# Patient Record
Sex: Female | Born: 2000
Health system: Southern US, Community
[De-identification: ages and names within clinical notes are randomized; demographics above are authoritative.]

## PROBLEM LIST (undated history)

## (undated) DIAGNOSIS — G43909 Migraine, unspecified, not intractable, without status migrainosus: Secondary | ICD-10-CM

## (undated) DIAGNOSIS — E669 Obesity, unspecified: Secondary | ICD-10-CM

## (undated) DIAGNOSIS — I2699 Other pulmonary embolism without acute cor pulmonale: Secondary | ICD-10-CM

## (undated) DIAGNOSIS — L309 Dermatitis, unspecified: Secondary | ICD-10-CM

## (undated) DIAGNOSIS — K2970 Gastritis, unspecified, without bleeding: Secondary | ICD-10-CM

## (undated) DIAGNOSIS — I82409 Acute embolism and thrombosis of unspecified deep veins of unspecified lower extremity: Secondary | ICD-10-CM

## (undated) HISTORY — PX: FOOT SURGERY: SHX648

## (undated) HISTORY — DX: Acute embolism and thrombosis of unspecified deep veins of unspecified lower extremity: I82.409

## (undated) HISTORY — DX: Dermatitis, unspecified: L30.9

## (undated) HISTORY — DX: Migraine, unspecified, not intractable, without status migrainosus: G43.909

## (undated) HISTORY — DX: Obesity, unspecified: E66.9

## (undated) HISTORY — DX: Gastritis, unspecified, without bleeding: K29.70

## (undated) HISTORY — DX: Other pulmonary embolism without acute cor pulmonale: I26.99

---
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
## 2019-04-24 ENCOUNTER — Emergency Department (HOSPITAL_COMMUNITY): Payer: Self-pay

## 2019-04-24 ENCOUNTER — Emergency Department (HOSPITAL_COMMUNITY)
Admission: EM | Admit: 2019-04-24 | Discharge: 2019-04-24 | Disposition: A | Discharge status: Home / Self Care | Payer: Self-pay | Attending: Emergency Medicine | Admitting: Emergency Medicine

## 2019-04-24 ENCOUNTER — Other Ambulatory Visit: Payer: Self-pay

## 2019-04-24 ENCOUNTER — Encounter (HOSPITAL_COMMUNITY): Payer: Self-pay

## 2019-04-24 DIAGNOSIS — R05 Cough: Secondary | ICD-10-CM | POA: Insufficient documentation

## 2019-04-24 DIAGNOSIS — R1110 Vomiting, unspecified: Secondary | ICD-10-CM | POA: Insufficient documentation

## 2019-04-24 DIAGNOSIS — R070 Pain in throat: Secondary | ICD-10-CM | POA: Insufficient documentation

## 2019-04-24 DIAGNOSIS — Z20822 Contact with and (suspected) exposure to covid-19: Secondary | ICD-10-CM | POA: Insufficient documentation

## 2019-04-24 DIAGNOSIS — R197 Diarrhea, unspecified: Secondary | ICD-10-CM | POA: Insufficient documentation

## 2019-04-24 DIAGNOSIS — R0981 Nasal congestion: Secondary | ICD-10-CM | POA: Insufficient documentation

## 2019-04-24 DIAGNOSIS — Z7722 Contact with and (suspected) exposure to environmental tobacco smoke (acute) (chronic): Secondary | ICD-10-CM | POA: Insufficient documentation

## 2019-04-24 DIAGNOSIS — J0100 Acute maxillary sinusitis, unspecified: Secondary | ICD-10-CM | POA: Insufficient documentation

## 2019-04-24 DIAGNOSIS — J45909 Unspecified asthma, uncomplicated: Secondary | ICD-10-CM | POA: Insufficient documentation

## 2019-04-24 LAB — CBC WITH DIFFERENTIAL/PLATELET
Abs Immature Granulocytes: 0.02 10*3/uL (ref 0.00–0.07)
Basophils Absolute: 0.1 10*3/uL (ref 0.0–0.1)
Basophils Relative: 1 %
Eosinophils Absolute: 0.5 10*3/uL (ref 0.0–0.5)
Eosinophils Relative: 5 %
HCT: 38.5 % (ref 36.0–46.0)
Hemoglobin: 12.6 g/dL (ref 12.0–15.0)
Immature Granulocytes: 0 %
Lymphocytes Relative: 32 %
Lymphs Abs: 2.8 10*3/uL (ref 0.7–4.0)
MCH: 28.7 pg (ref 26.0–34.0)
MCHC: 32.7 g/dL (ref 30.0–36.0)
MCV: 87.7 fL (ref 80.0–100.0)
Monocytes Absolute: 0.6 10*3/uL (ref 0.1–1.0)
Monocytes Relative: 7 %
Neutro Abs: 4.9 10*3/uL (ref 1.7–7.7)
Neutrophils Relative %: 55 %
Platelets: 385 10*3/uL (ref 150–400)
RBC: 4.39 MIL/uL (ref 3.87–5.11)
RDW: 13.2 % (ref 11.5–15.5)
WBC: 8.9 10*3/uL (ref 4.0–10.5)
nRBC: 0 % (ref 0.0–0.2)

## 2019-04-24 LAB — URINALYSIS, ROUTINE W REFLEX MICROSCOPIC
Bilirubin Urine: NEGATIVE
Glucose, UA: NEGATIVE mg/dL
Hgb urine dipstick: NEGATIVE
Ketones, ur: NEGATIVE mg/dL
Leukocytes,Ua: NEGATIVE
Nitrite: NEGATIVE
Protein, ur: NEGATIVE mg/dL
Specific Gravity, Urine: 1.024 (ref 1.005–1.030)
pH: 7 (ref 5.0–8.0)

## 2019-04-24 LAB — COMPREHENSIVE METABOLIC PANEL
ALT: 19 U/L (ref 0–44)
AST: 18 U/L (ref 15–41)
Albumin: 4 g/dL (ref 3.5–5.0)
Alkaline Phosphatase: 66 U/L (ref 38–126)
Anion gap: 8 (ref 5–15)
BUN: 13 mg/dL (ref 6–20)
CO2: 24 mmol/L (ref 22–32)
Calcium: 8.9 mg/dL (ref 8.9–10.3)
Chloride: 107 mmol/L (ref 98–111)
Creatinine, Ser: 0.78 mg/dL (ref 0.44–1.00)
GFR calc Af Amer: >60 mL/min (ref >60)
GFR calc non Af Amer: >60 mL/min (ref >60)
Glucose, Bld: 89 mg/dL (ref 70–99)
Potassium: 4 mmol/L (ref 3.5–5.1)
Sodium: 139 mmol/L (ref 135–145)
Total Bilirubin: 0.9 mg/dL (ref 0.3–1.2)
Total Protein: 7.4 g/dL (ref 6.5–8.1)

## 2019-04-24 LAB — GROUP A STREP BY PCR: Group A Strep by PCR: NOT DETECTED

## 2019-04-24 LAB — LIPASE, BLOOD: Lipase: 18 U/L (ref 11–51)

## 2019-04-24 LAB — PREGNANCY, URINE: Preg Test, Ur: NEGATIVE

## 2019-04-24 LAB — POC SARS CORONAVIRUS 2 AG -  ED: SARS Coronavirus 2 Ag: NEGATIVE

## 2019-04-24 MED ORDER — PROMETHAZINE HCL 25 MG/ML IJ SOLN
12.5000 mg | Freq: Once | INTRAMUSCULAR | Status: AC
  Administered 2019-04-24: 12.5 mg via INTRAVENOUS
  Filled 2019-04-24: qty 1

## 2019-04-24 MED ORDER — ONDANSETRON HCL 4 MG/2ML IJ SOLN
4.0000 mg | Freq: Once | INTRAMUSCULAR | Status: AC
  Administered 2019-04-24: 19:00:00 4 mg via INTRAVENOUS
  Filled 2019-04-24: qty 2

## 2019-04-24 MED ORDER — AMOXICILLIN-POT CLAVULANATE 875-125 MG PO TABS: 1.0000 | ORAL_TABLET | Freq: Two times a day (BID) | ORAL | 0 refills | Status: DC

## 2019-04-24 MED ORDER — ACETAMINOPHEN 325 MG PO TABS
650.0000 mg | ORAL_TABLET | Freq: Once | ORAL | Status: AC
  Administered 2019-04-24: 20:00:00 650 mg via ORAL
  Filled 2019-04-24: qty 2

## 2019-04-24 MED ORDER — ALBUTEROL SULFATE HFA 108 (90 BASE) MCG/ACT IN AERS
4.0000 | INHALATION_SPRAY | Freq: Once | RESPIRATORY_TRACT | Status: AC
  Administered 2019-04-24: 22:00:00 4 via RESPIRATORY_TRACT
  Filled 2019-04-24: qty 6.7

## 2019-04-24 MED ORDER — BENZONATATE 100 MG PO CAPS: 100.0000 mg | ORAL_CAPSULE | Freq: Three times a day (TID) | ORAL | 0 refills | Status: DC | PRN

## 2019-04-24 MED ORDER — BENZONATATE 100 MG PO CAPS
200.0000 mg | ORAL_CAPSULE | Freq: Once | ORAL | Status: AC
  Administered 2019-04-24: 200 mg via ORAL
  Filled 2019-04-24: qty 2

## 2019-04-24 MED ORDER — ONDANSETRON 4 MG PO TBDP: 4.0000 mg | ORAL_TABLET | Freq: Three times a day (TID) | ORAL | 0 refills | Status: DC | PRN

## 2019-04-24 MED ORDER — SODIUM CHLORIDE 0.9 % IV BOLUS
1000.0000 mL | Freq: Once | INTRAVENOUS | Status: AC
  Administered 2019-04-24: 19:00:00 1000 mL via INTRAVENOUS

## 2019-04-24 NOTE — Discharge Instructions (Addendum)
You were seen in the ED for cough, congestion, sore throat, vomiting, diarrhea.  I suspect you have a virus. We tested your for COVID-19 (coronavirus) infection.  It is also possible you could have other viral upper respiratory infection from another virus.    -Prescription has been sent to your pharmacy for Zofran.  This is for nausea.  Please take if needed. -Prescription sent for Augmentin.  This is an antibiotic used to treat sinus infection.  Please take as prescribed. -Prescription also sent for Tessalon.  This is a cough medicine.  Please take as prescribed and if needed.  Test results come back in 24  hours, sometimes sooner.  Someone will call you if ypu are positive for COVID. If the result is negative you can see it on your MyChart.  Treatment of your illness and symptoms will include self-isolation, monitoring of symptoms and supportive care with over-the-counter medicines.    Return to the ED if there is increased work of breathing, shortness of breath, inability to tolerate fluids, weakness, chest pain.  If your test results are POSITIVE, the following isolation requirements need to be met to return to work and resume essential activities: At least 10 days since symptom onset  72 hours of absence of fever without antifever medicine (ibuprofen, acetaminophen). A fever is temperature of 100.32F or greater. Improvement of respiratory symptoms  If your test is NEGATIVE, you may return to work and essential activities as long as your symptoms have improved and you do not have a fever for a total of 3 days.  Call your job and notify them that your test result was negative to see if they will allow you to return to work.   Stay well-hydrated. Rest. You can use over the counter medications to help with symptoms: 600 mg ibuprofen (motrin, aleve, advil) or acetaminophen (tylenol) every 6 hours, around the clock to help with associated fevers, sore throat, headaches, generalized body aches and  malaise.  Oxymetazoline (afrin) intranasal spray once daily for no more than 3 days to help with congestion, after 3 days you can switch to another over-the-counter nasal steroid spray such as fluticasone (flonase) Allergy medication (loratadine, cetirizine, etc) and phenylephrine (sudafed) help with nasal congestion, runny nose and postnasal drip.   Wash your hands often to prevent spread.

## 2019-04-24 NOTE — ED Provider Notes (Signed)
[facility] EMERGENCY DEPARTMENT Provider Note   CSN: [id number] Arrival date & time: 04/24/19  1726     History Chief Complaint  Patient presents with  . Headache  . Cough    [name] is a 19 y.o. female past medical history significant for asthma, seasonal allergies presents to emergency department today with chief complaint intermittent headache, congestion, cough x1 week.  Patient states she has history of severe seasonal allergies.  She has been taking Zyrtec at home without any symptom relief. She is reporting nasal congestion with sinus pressure that is worse when leaning forward. She also has sore throat that she describes as raw sensation when swallowing. Today she has 1 episode of non bloody non bilious emesis and 1 episode of non bloody diarrhea. She denies any associated abdominal pain. She states her headache has been mild. She does not have headache currently but has intermittently since symptoms started. She denies any associated neck pain or neck stiffness, no visual changes. She also denies fever, chills, loss of sense of smell or taste, shortness of breath, chest pain, urinary symptoms, vaginal discharge, abnormal vaginal bleeding.. rash.  History provided by patient with additional history obtained from chart review.     Past Medical History:  Diagnosis Date  . Asthma   . Eczema   . Obesity     Patient Active Problem List   Diagnosis Date Noted  . Obesity peds (BMI >=95 percentile) 04/25/2016  . Allergic conjunctivitis of both eyes 04/25/2016  . Onychomycosis 04/25/2016  . Chronic pain of right knee 04/25/2016  . Eczema 04/25/2016  . Mild intermittent asthma, uncomplicated 04/25/2016    Past Surgical History:  Procedure Laterality Date  . FOOT SURGERY     bilateral arch repair     OB History    Gravida  0   Para  0   Term  0   Preterm  0   AB  0   Living  0     SAB  0   TAB  0   Ectopic  0   Multiple  0   Live Births  0          Family History  Problem Relation Age of Onset  . Healthy Mother     Social History   Tobacco Use  . Smoking status: Passive Smoke Exposure - Never Smoker  . Smokeless tobacco: Never Used  Substance Use Topics  . Alcohol use: Yes    Comment: occ  . Drug use: No    Home Medications Prior to Admission medications   Medication Sig Start Date End Date Taking? Authorizing Provider  cetirizine (ZYRTEC) 10 MG tablet Take 10 mg by mouth daily.   Yes [provider]  EPINEPHrine (EPIPEN 2-PAK) 0.3 mg/0.3 mL IJ SOAJ injection Inject 0.3 mLs (0.3 mg total) into the muscle as needed for anaphylaxis. 08/04/18  Yes Pollina, Canary Brim, MD  albuterol (PROAIR HFA) 108 (90 Base) MCG/ACT inhaler 2 puffs every 4 to 6 hours as needed for wheezing or cough Patient not taking: Reported on 04/24/2019 01/17/18   Dione Booze, MD  albuterol (PROVENTIL) (2.5 MG/3ML) 0.083% nebulizer solution 1 vial via neb Q4h x 3 days then Q6h x 3 days then Q4-6h prn Patient not taking: Reported on 04/24/2019 01/17/18   Dione Booze, MD  amoxicillin-clavulanate (AUGMENTIN) 875-125 MG tablet Take 1 tablet by mouth every 12 (twelve) hours. 04/24/19   Alicia Ackert E, PA-C  benzonatate (TESSALON) 100 MG capsule Take  1 capsule (100 mg total) by mouth every 8 (eight) hours as needed for cough. 04/24/19   Levy Wellman E, PA-C  ondansetron (ZOFRAN ODT) 4 MG disintegrating tablet Take 1 tablet (4 mg total) by mouth every 8 (eight) hours as needed for nausea or vomiting. 04/24/19   Brooklyne Radke, Caroleen Hamman, PA-C    Allergies    Hydrocortisone-emollient  Review of Systems   Review of Systems  All other systems are reviewed and are negative for acute change except as noted in the HPI.   Physical Exam Updated Vital Signs BP (!) 142/91 (BP Location: Right Arm)   Pulse 77   Temp 97.8 F (36.6 C) (Tympanic)   Resp 16   Ht 5\' 5"  (1.651 m)   Wt 108.9 kg   LMP 04/02/2019   SpO2 99%   BMI 39.94 kg/m    Physical Exam Vitals and nursing note reviewed.  Constitutional:      General: She is not in acute distress.    Appearance: She is not ill-appearing.     Comments: Patient actively vomiting during exam  HENT:     Head: Normocephalic and atraumatic.     Comments: No temporal tenderness.  Sinus tenderness to maxillary sinuses     Right Ear: Tympanic membrane and external ear normal.     Left Ear: Tympanic membrane and external ear normal.     Nose: Congestion present.     Mouth/Throat:     Mouth: Mucous membranes are moist.     Pharynx: Oropharynx is clear.     Comments: Minor erythema to oropharynx, no edema, no exudate, no tonsillar swelling, voice normal, neck supple without lymphadenopathy  Eyes:     General: No scleral icterus.       Right eye: No discharge.        Left eye: No discharge.     Extraocular Movements: Extraocular movements intact.     Conjunctiva/sclera: Conjunctivae normal.     Pupils: Pupils are equal, round, and reactive to light.  Neck:     Vascular: No JVD.  Cardiovascular:     Rate and Rhythm: Normal rate and regular rhythm.     Pulses: Normal pulses.          Radial pulses are 2+ on the right side and 2+ on the left side.     Heart sounds: Normal heart sounds.  Pulmonary:     Comments: Lungs clear to auscultation in all fields. Symmetric chest rise. No wheezing, rales, or rhonchi. Oxygen saturation 99% on room air Abdominal:     Tenderness: There is no right CVA tenderness or left CVA tenderness.     Comments: Abdomen is soft, non-distended, and non-tender in all quadrants. No rigidity, no guarding. No peritoneal signs.  Musculoskeletal:        General: Normal range of motion.     Cervical back: Normal range of motion.  Skin:    General: Skin is warm and dry.     Capillary Refill: Capillary refill takes less than 2 seconds.  Neurological:     Mental Status: She is oriented to person, place, and time.     GCS: GCS eye subscore is 4. GCS verbal  subscore is 5. GCS motor subscore is 6.     Comments: Speech is clear and goal oriented, follows commands CN III-XII intact, no facial droop Normal strength in upper and lower extremities bilaterally including dorsiflexion and plantar flexion, strong and equal grip strength Sensation normal to light and  sharp touch Moves extremities without ataxia, coordination intact Normal finger to nose and rapid alternating movements Normal gait and balance   Psychiatric:        Behavior: Behavior normal.     ED Results / Procedures / Treatments   Labs (all labs ordered are listed, but only abnormal results are displayed) Labs Reviewed  GROUP A STREP BY PCR  SARS CORONAVIRUS 2 (TAT 6-24 HRS)  CBC WITH DIFFERENTIAL/PLATELET  COMPREHENSIVE METABOLIC PANEL  LIPASE, BLOOD  URINALYSIS, ROUTINE W REFLEX MICROSCOPIC  PREGNANCY, URINE  POC SARS CORONAVIRUS 2 AG -  ED    EKG None  Radiology DG Chest Portable 1 View  Result Date: 04/24/2019 CLINICAL DATA:  19 year old female with cough and shortness of breath for several days. EXAM: PORTABLE CHEST 1 VIEW COMPARISON:  Chest radiographs 01/16/2018 and earlier. FINDINGS: Portable AP semi upright view at 1837 hours. Lung volumes and mediastinal contours remain within normal limits. Allowing for portable technique the lungs are clear. No pneumothorax or pleural effusion. Visualized tracheal air column is within normal limits. Negative visible bowel gas pattern and osseous structures. IMPRESSION: Negative portable chest. Electronically Signed   By: Genevie Ann M.D.   On: 04/24/2019 19:00    Procedures Procedures (including critical care time)  Medications Ordered in ED Medications  sodium chloride 0.9 % bolus 1,000 mL (0 mLs Intravenous Stopped 04/24/19 1951)  ondansetron (ZOFRAN) injection 4 mg (4 mg Intravenous Given 04/24/19 1843)  acetaminophen (TYLENOL) tablet 650 mg (650 mg Oral Given 04/24/19 2024)  promethazine (PHENERGAN) injection 12.5 mg (12.5 mg  Intravenous Given 04/24/19 2110)  benzonatate (TESSALON) capsule 200 mg (200 mg Oral Given 04/24/19 2137)  albuterol (VENTOLIN HFA) 108 (90 Base) MCG/ACT inhaler 4-6 puff (4 puffs Inhalation Given 04/24/19 2137)    ED Course  I have reviewed the triage vital signs and the nursing notes.  Pertinent labs & imaging results that were available during my care of the patient were reviewed by me and considered in my medical decision making (see chart for details).    MDM Rules/Calculators/A&P                      Patient seen and examined. Patient presents awake, alert, hemodynamically stable, afebrile, non toxic. On exam she looks to not feel well however does not appear toxic.  Lungs are clear to auscultation all fields.  Normal work of breathing, no abdominal tenderness, no peritoneal signs.  She has erythema to posterior oropharynx without exudate, deep space infection, PTA. No neck pain or meningeal signs.  Neuro exam is normal.  Towards end of my exam patient had one episode of nonbloody nonbilious emesis.  DDx includes URI, Covid, sinus infection, strep, viral GI illness, Covid, asthma exacerbation. I viewed pt's chest xray and it does not suggest acute infectious processes  Labs show no leukocytosis, no anemia, no renal insufficiency, no severe electrolyte derangement.  Lipase is within normal range.  UA is negative for infection.  Urine pregnancy test is negative.  Rapid Covid test is negative.  Discussed with patient this could be false negative and will perform PCR test to confirm.  Strep test is negative.  Patient given Zofran and IV fluids.  On reassessment she had a second episode of emesis. Phenergan given and on reassessment she is tolerating p.o. intake.  Serial abdominal exams have been benign, no indications of acute surgical abdomen or for emergent imaging. Will discharge home with symptomatic care including antibiotic for possible sinus  infection as she has had sinus congestion and  pressure x7 days.  The patient appears reasonably screened and/or stabilized for discharge and I doubt any other medical condition or other [facility] requiring further screening, evaluation, or treatment in the ED at this time prior to discharge. The patient is safe for discharge with strict return precautions discussed. Recommend pcp follow up if symptoms persist.   Grae L Goehring was evaluated in Emergency Department on 04/24/2019 for the symptoms described in the history of present illness. She was evaluated in the context of the global COVID-19 pandemic, which necessitated consideration that the patient might be at risk for infection with the SARS-CoV-2 virus that causes COVID-19. Institutional protocols and algorithms that pertain to the evaluation of patients at risk for COVID-19 are in a state of rapid change based on information released by regulatory bodies including the CDC and federal and state organizations. These policies and algorithms were followed during the patient's care in the ED.   Portions of this note were generated with Scientist, clinical (histocompatibility and immunogenetics). Dictation errors may occur despite best attempts at proofreading.   Final Clinical Impression(s) / ED Diagnoses Final diagnoses:  Acute maxillary sinusitis, recurrence not specified    Rx / DC Orders ED Discharge Orders         Ordered    ondansetron (ZOFRAN ODT) 4 MG disintegrating tablet  Every 8 hours PRN     04/24/19 2231    amoxicillin-clavulanate (AUGMENTIN) 875-125 MG tablet  Every 12 hours     04/24/19 2231    benzonatate (TESSALON) 100 MG capsule  Every 8 hours PRN     04/24/19 2231           Brittony Billick, Caroleen Hamman, PA-C 04/24/19 2239    Vanetta Mulders, MD 05/01/19 1043

## 2019-04-24 NOTE — ED Triage Notes (Addendum)
Pt c/o headache, N/V/D, sore throat, dry cough and chills x1 week. Pt states she has severe seasonal allergies but that some of the symptoms "feel different than normal".   Pt eyes appear red and watery in triage. Pt actively coughing.  Pt states she has been taking Zyrtec and Cetirizine wihtout relief.   Pt requesting COVID test.

## 2019-04-24 NOTE — ED Notes (Signed)
Pt vomiting after po challenge. Provider aware

## 2019-04-25 LAB — SARS CORONAVIRUS 2 (TAT 6-24 HRS): SARS Coronavirus 2: NEGATIVE

## 2019-07-16 ENCOUNTER — Emergency Department (HOSPITAL_COMMUNITY): Payer: Medicaid Other

## 2019-07-16 ENCOUNTER — Encounter (HOSPITAL_COMMUNITY): Payer: Self-pay | Admitting: Emergency Medicine

## 2019-07-16 ENCOUNTER — Other Ambulatory Visit: Payer: Self-pay

## 2019-07-16 ENCOUNTER — Emergency Department (HOSPITAL_COMMUNITY)
Admission: EM | Admit: 2019-07-16 | Discharge: 2019-07-16 | Disposition: A | Discharge status: Home / Self Care | Payer: Medicaid Other | Attending: Emergency Medicine | Admitting: Emergency Medicine

## 2019-07-16 DIAGNOSIS — Z7722 Contact with and (suspected) exposure to environmental tobacco smoke (acute) (chronic): Secondary | ICD-10-CM | POA: Insufficient documentation

## 2019-07-16 DIAGNOSIS — M79605 Pain in left leg: Secondary | ICD-10-CM | POA: Insufficient documentation

## 2019-07-16 DIAGNOSIS — J4520 Mild intermittent asthma, uncomplicated: Secondary | ICD-10-CM | POA: Insufficient documentation

## 2019-07-16 MED ORDER — TRAMADOL HCL 50 MG PO TABS: 50.0000 mg | ORAL_TABLET | Freq: Four times a day (QID) | ORAL | 0 refills | Status: DC | PRN

## 2019-07-16 MED ORDER — NAPROXEN 500 MG PO TABS: 500.0000 mg | ORAL_TABLET | Freq: Two times a day (BID) | ORAL | 0 refills | Status: DC

## 2019-07-16 NOTE — ED Provider Notes (Signed)
[facility] EMERGENCY DEPARTMENT Provider Note   CSN: [id number] Arrival date & time: 07/16/19  1046     History Chief Complaint  Patient presents with  . Knee Pain    [name] is a 19 y.o. female.  Patient is a 19 year old female with no significant past medical history.  She presents today for evaluation of left knee and calf pain.  This began several days ago, but significantly worsened last night.  She denies any specific injury or trauma, but is on her feet throughout the day for work.  She denies any chest pain or difficulty breathing.  She denies any swelling.  The history is provided by the patient.  Knee Pain Location:  Leg Injury: no   Leg location:  L leg Pain details:    Quality:  Aching   Severity:  Moderate   Onset quality:  Gradual   Duration:  3 days   Timing:  Constant   Progression:  Worsening Chronicity:  New Relieved by:  Nothing Worsened by:  Bearing weight and activity Ineffective treatments:  None tried      Past Medical History:  Diagnosis Date  . Asthma   . Eczema   . Obesity     Patient Active Problem List   Diagnosis Date Noted  . Obesity peds (BMI >=95 percentile) 04/25/2016  . Allergic conjunctivitis of both eyes 04/25/2016  . Onychomycosis 04/25/2016  . Chronic pain of right knee 04/25/2016  . Eczema 04/25/2016  . Mild intermittent asthma, uncomplicated 04/25/2016    Past Surgical History:  Procedure Laterality Date  . FOOT SURGERY     bilateral arch repair     OB History    Gravida  0   Para  0   Term  0   Preterm  0   AB  0   Living  0     SAB  0   TAB  0   Ectopic  0   Multiple  0   Live Births  0           Family History  Problem Relation Age of Onset  . Healthy Mother     Social History   Tobacco Use  . Smoking status: Passive Smoke Exposure - Never Smoker  . Smokeless tobacco: Never Used  Vaping Use  . Vaping Use: Every day  . Substances: Nicotine, Mixture of cannabinoids    Substance Use Topics  . Alcohol use: Yes    Comment: occ  . Drug use: Yes    Home Medications Prior to Admission medications   Medication Sig Start Date End Date Taking? Authorizing Provider  EPINEPHrine (EPIPEN 2-PAK) 0.3 mg/0.3 mL IJ SOAJ injection Inject 0.3 mLs (0.3 mg total) into the muscle as needed for anaphylaxis. 08/04/18  Yes Pollina, Canary Brim, MD  albuterol (PROAIR HFA) 108 (90 Base) MCG/ACT inhaler 2 puffs every 4 to 6 hours as needed for wheezing or cough Patient not taking: Reported on 04/24/2019 01/17/18   Dione Booze, MD  albuterol (PROVENTIL) (2.5 MG/3ML) 0.083% nebulizer solution 1 vial via neb Q4h x 3 days then Q6h x 3 days then Q4-6h prn Patient not taking: Reported on 04/24/2019 01/17/18   Dione Booze, MD  amoxicillin-clavulanate (AUGMENTIN) 875-125 MG tablet Take 1 tablet by mouth every 12 (twelve) hours. Patient not taking: Reported on 07/16/2019 04/24/19   Albrizze, Yvonna Alanis E, PA-C  ondansetron (ZOFRAN ODT) 4 MG disintegrating tablet Take 1 tablet (4 mg total) by mouth every 8 (eight) hours  as needed for nausea or vomiting. Patient not taking: Reported on 07/16/2019 04/24/19   Albrizze, Caroleen Hamman, PA-C    Allergies    Hydrocortisone-emollient  Review of Systems   Review of Systems  All other systems reviewed and are negative.   Physical Exam Updated Vital Signs BP (!) 136/93   Pulse 78   Temp 98.5 F (36.9 C) (Oral)   Resp 18   Ht 5\' 6"  (1.676 m)   Wt 122.5 kg   LMP 07/02/2019 (Approximate)   SpO2 99%   BMI 43.58 kg/m   Physical Exam Vitals and nursing note reviewed.  Constitutional:      General: She is not in acute distress.    Appearance: Normal appearance. She is not ill-appearing.  HENT:     Head: Normocephalic and atraumatic.  Pulmonary:     Effort: Pulmonary effort is normal.  Musculoskeletal:     Cervical back: Normal range of motion and neck supple.     Comments: Knee appears grossly normal.  There is no palpable effusion.  Pain with  range of motion, but there is no crepitus.  There is tenderness of the left posterior calf.  There is no edema of the ankle or foot.  DP pulses are easily palpable and motor and sensation are intact.  Skin:    General: Skin is warm and dry.  Neurological:     Mental Status: She is alert.     ED Results / Procedures / Treatments   Labs (all labs ordered are listed, but only abnormal results are displayed) Labs Reviewed - No data to display  EKG None  Radiology No results found.  Procedures Procedures (including critical care time)  Medications Ordered in ED Medications - No data to display  ED Course  I have reviewed the triage vital signs and the nursing notes.  Pertinent labs & imaging results that were available during my care of the patient were reviewed by me and considered in my medical decision making (see chart for details).    MDM Rules/Calculators/A&P  Patient presenting here with complaints of left leg pain, the etiology of which I suspect is musculoskeletal.  Patient has pain to the posterior calf, however no evidence for DVT on ultrasound.  X-rays of the knee and foot are both unremarkable.  At this point, I feel as though emergent pathology has been ruled out.  Patient will be treated with an anti-inflammatory, tramadol, and is to follow-up with primary doctor if not improving in the next week.  Patient will be given a work excuse for the next several days.  Final Clinical Impression(s) / ED Diagnoses Final diagnoses:  None    Rx / DC Orders ED Discharge Orders    None       07/04/2019, MD 07/16/19 1400

## 2019-07-16 NOTE — ED Triage Notes (Signed)
Patient complains of left knee pain that began yesterday but has gotten progressively worse. Patient says the pain is behind her knee and is causing numbness to her toes.

## 2019-07-16 NOTE — Discharge Instructions (Addendum)
Begin taking naproxen as prescribed.  Take tramadol as prescribed as needed for pain not relieved with naproxen.  Minimal activity as tolerated for the next several days, then slowly reintroduce activity.  Follow-up with your primary doctor if not improving in the week, and return to the ER if symptoms significantly worsen or change.Marland Kitchen

## 2019-08-19 ENCOUNTER — Encounter (HOSPITAL_COMMUNITY): Payer: Self-pay | Admitting: *Deleted

## 2019-08-19 ENCOUNTER — Emergency Department (HOSPITAL_COMMUNITY)
Admission: EM | Admit: 2019-08-19 | Discharge: 2019-08-19 | Disposition: A | Discharge status: Home / Self Care | Payer: HRSA Program | Attending: Emergency Medicine | Admitting: Emergency Medicine

## 2019-08-19 ENCOUNTER — Emergency Department (HOSPITAL_COMMUNITY): Payer: HRSA Program

## 2019-08-19 ENCOUNTER — Telehealth: Payer: Self-pay | Admitting: Nurse Practitioner

## 2019-08-19 ENCOUNTER — Other Ambulatory Visit: Payer: Self-pay

## 2019-08-19 DIAGNOSIS — M791 Myalgia, unspecified site: Secondary | ICD-10-CM | POA: Diagnosis present

## 2019-08-19 DIAGNOSIS — Z7722 Contact with and (suspected) exposure to environmental tobacco smoke (acute) (chronic): Secondary | ICD-10-CM | POA: Insufficient documentation

## 2019-08-19 DIAGNOSIS — J45909 Unspecified asthma, uncomplicated: Secondary | ICD-10-CM | POA: Insufficient documentation

## 2019-08-19 DIAGNOSIS — U071 COVID-19: Secondary | ICD-10-CM | POA: Diagnosis not present

## 2019-08-19 LAB — SARS CORONAVIRUS 2 BY RT PCR (HOSPITAL ORDER, PERFORMED IN ~~LOC~~ HOSPITAL LAB): SARS Coronavirus 2: POSITIVE — AB

## 2019-08-19 MED ORDER — ALBUTEROL SULFATE HFA 108 (90 BASE) MCG/ACT IN AERS
3.0000 | INHALATION_SPRAY | Freq: Once | RESPIRATORY_TRACT | Status: AC
  Administered 2019-08-19: 3 via RESPIRATORY_TRACT
  Filled 2019-08-19: qty 6.7

## 2019-08-19 MED ORDER — ACETAMINOPHEN 325 MG PO TABS
650.0000 mg | ORAL_TABLET | Freq: Once | ORAL | Status: AC
  Administered 2019-08-19: 650 mg via ORAL
  Filled 2019-08-19: qty 2

## 2019-08-19 NOTE — ED Notes (Signed)
Patient states she had been moving and thought she was just tired.  The she started having a fever n/v and chills.

## 2019-08-19 NOTE — ED Provider Notes (Signed)
[facility] EMERGENCY DEPARTMENT Provider Note   CSN: [id number] Arrival date & time: 08/19/19  9678     History Chief Complaint  Patient presents with  . Generalized Body Aches    [name] is a 19 y.o. female.  Pt reports she has a fever, bodyaches and a cough.  Pt unsure if she has been exposed to covid  The history is provided by the patient. No language interpreter was used.       Past Medical History:  Diagnosis Date  . Asthma   . Eczema   . Obesity     Patient Active Problem List   Diagnosis Date Noted  . Obesity peds (BMI >=95 percentile) 04/25/2016  . Allergic conjunctivitis of both eyes 04/25/2016  . Onychomycosis 04/25/2016  . Chronic pain of right knee 04/25/2016  . Eczema 04/25/2016  . Mild intermittent asthma, uncomplicated 04/25/2016    Past Surgical History:  Procedure Laterality Date  . FOOT SURGERY     bilateral arch repair     OB History    Gravida  0   Para  0   Term  0   Preterm  0   AB  0   Living  0     SAB  0   TAB  0   Ectopic  0   Multiple  0   Live Births  0           Family History  Problem Relation Age of Onset  . Healthy Mother     Social History   Tobacco Use  . Smoking status: Passive Smoke Exposure - Never Smoker  . Smokeless tobacco: Never Used  Vaping Use  . Vaping Use: Every day  . Substances: Nicotine, Mixture of cannabinoids  Substance Use Topics  . Alcohol use: Yes    Comment: occ  . Drug use: Yes    Home Medications Prior to Admission medications   Medication Sig Start Date End Date Taking? Authorizing Provider  EPINEPHrine (EPIPEN 2-PAK) 0.3 mg/0.3 mL IJ SOAJ injection Inject 0.3 mLs (0.3 mg total) into the muscle as needed for anaphylaxis. 08/04/18   Gilda Crease, MD  naproxen (NAPROSYN) 500 MG tablet Take 1 tablet (500 mg total) by mouth 2 (two) times daily. 07/16/19   Geoffery Lyons, MD  traMADol (ULTRAM) 50 MG tablet Take 1 tablet (50 mg total) by mouth every 6  (six) hours as needed. 07/16/19   Geoffery Lyons, MD  albuterol (PROAIR HFA) 108 (90 Base) MCG/ACT inhaler 2 puffs every 4 to 6 hours as needed for wheezing or cough Patient not taking: Reported on 04/24/2019 01/17/18 08/19/19  Dione Booze, MD    Allergies    Hydrocortisone-emollient  Review of Systems   Review of Systems  All other systems reviewed and are negative.   Physical Exam Updated Vital Signs BP 130/82 (BP Location: Right Arm)   Pulse 100   Temp (!) 100.4 F (38 C) (Oral)   Resp 20   Ht 5\' 6"  (1.676 m)   Wt 115.7 kg   SpO2 98%   BMI 41.16 kg/m   Physical Exam Vitals and nursing note reviewed.  Constitutional:      Appearance: She is well-developed.  HENT:     Head: Normocephalic.  Cardiovascular:     Rate and Rhythm: Normal rate.     Pulses: Normal pulses.  Pulmonary:     Effort: Pulmonary effort is normal.  Abdominal:     General: Abdomen is flat.  There is no distension.  Musculoskeletal:        General: Normal range of motion.     Cervical back: Normal range of motion.  Skin:    General: Skin is warm.  Neurological:     Mental Status: She is alert and oriented to person, place, and time.  Psychiatric:        Mood and Affect: Mood normal.     ED Results / Procedures / Treatments   Labs (all labs ordered are listed, but only abnormal results are displayed) Labs Reviewed  SARS CORONAVIRUS 2 BY RT PCR (HOSPITAL ORDER, PERFORMED IN [facility]) - Abnormal; Notable for the following components:      Result Value   SARS Coronavirus 2 POSITIVE (*)    All other components within normal limits    EKG None  Radiology No results found.  Procedures Procedures (including critical care time)  Medications Ordered in ED Medications  acetaminophen (TYLENOL) tablet 650 mg (650 mg Oral Given 08/19/19 1048)  albuterol (VENTOLIN HFA) 108 (90 Base) MCG/ACT inhaler 3 puff (3 puffs Inhalation Given 08/19/19 1049)    ED Course  I have reviewed the  triage vital signs and the nursing notes.  Pertinent labs & imaging results that were available during my care of the patient were reviewed by me and considered in my medical decision making (see chart for details).    MDM Rules/Calculators/A&P                          MDM:  Pt's covid test is positive, temp decreased to 100.4 with tylenol.  Chest xray shows no evidence of pneumonia.  Pt given albuterol inhaler.   Pt referred to infusion clinic due to asthma and BMI Final Clinical Impression(s) / ED Diagnoses Final diagnoses:  COVID-19    Rx / DC Orders ED Discharge Orders    None    An After Visit Summary was printed and given to the patient.    Elson Areas, [place] 08/19/19 1657    Maia Plan, MD 08/21/19 1944

## 2019-08-19 NOTE — ED Triage Notes (Signed)
Pt states she started having aches and pains x 4 days that has progressed to fevers with n/v and chills with sob; temp at home was 103;

## 2019-08-19 NOTE — Telephone Encounter (Signed)
Called to Discuss with patient about Covid symptoms and the use of bamlanivimab, a monoclonal antibody infusion for those with mild to moderate Covid symptoms and at a high risk of hospitalization.     Pt is qualified for this infusion at the [facility] infusion center due to co-morbid conditions and/or a member of an at-risk group.     Patient Active Problem List   Diagnosis Date Noted  . Obesity peds (BMI >=95 percentile) 04/25/2016  . Allergic conjunctivitis of both eyes 04/25/2016  . Onychomycosis 04/25/2016  . Chronic pain of right knee 04/25/2016  . Eczema 04/25/2016  . Mild intermittent asthma, uncomplicated 04/25/2016    Patient declines infusion at this time. Symptoms tier reviewed as well as criteria for ending isolation. Preventative practices reviewed. Patient verbalized understanding.    Patient advised to call back if she decides that she does want to get infusion. Callback number to the infusion center given. Patient advised to go to Urgent care or ED with severe symptoms.

## 2019-08-19 NOTE — Discharge Instructions (Signed)
You have been referred to the Infusion clinic.  Tylenol every 4 hours.  Albuterol 2 puffs every 4 hours.

## 2019-08-24 ENCOUNTER — Other Ambulatory Visit: Payer: Self-pay

## 2019-08-24 ENCOUNTER — Encounter (HOSPITAL_COMMUNITY): Payer: Self-pay | Admitting: Emergency Medicine

## 2019-08-24 ENCOUNTER — Inpatient Hospital Stay (HOSPITAL_COMMUNITY)
Admission: EM | Admit: 2019-08-24 | Discharge: 2019-08-27 | DRG: 177 | Disposition: A | Discharge status: Home / Self Care | Payer: HRSA Program | Attending: Internal Medicine | Admitting: Internal Medicine

## 2019-08-24 ENCOUNTER — Emergency Department (HOSPITAL_COMMUNITY): Payer: HRSA Program

## 2019-08-24 DIAGNOSIS — U071 COVID-19: Secondary | ICD-10-CM

## 2019-08-24 DIAGNOSIS — M79606 Pain in leg, unspecified: Secondary | ICD-10-CM | POA: Diagnosis present

## 2019-08-24 DIAGNOSIS — J4530 Mild persistent asthma, uncomplicated: Secondary | ICD-10-CM | POA: Diagnosis present

## 2019-08-24 DIAGNOSIS — Z888 Allergy status to other drugs, medicaments and biological substances status: Secondary | ICD-10-CM | POA: Diagnosis not present

## 2019-08-24 DIAGNOSIS — J4520 Mild intermittent asthma, uncomplicated: Secondary | ICD-10-CM | POA: Diagnosis not present

## 2019-08-24 DIAGNOSIS — E66813 Obesity, class 3: Secondary | ICD-10-CM | POA: Diagnosis present

## 2019-08-24 DIAGNOSIS — M79604 Pain in right leg: Secondary | ICD-10-CM

## 2019-08-24 DIAGNOSIS — J069 Acute upper respiratory infection, unspecified: Secondary | ICD-10-CM | POA: Diagnosis not present

## 2019-08-24 DIAGNOSIS — Z6841 Body Mass Index (BMI) 40.0 and over, adult: Secondary | ICD-10-CM | POA: Diagnosis not present

## 2019-08-24 DIAGNOSIS — E876 Hypokalemia: Secondary | ICD-10-CM | POA: Diagnosis present

## 2019-08-24 DIAGNOSIS — R112 Nausea with vomiting, unspecified: Secondary | ICD-10-CM | POA: Diagnosis present

## 2019-08-24 DIAGNOSIS — J9601 Acute respiratory failure with hypoxia: Secondary | ICD-10-CM | POA: Diagnosis present

## 2019-08-24 DIAGNOSIS — J1282 Pneumonia due to coronavirus disease 2019: Secondary | ICD-10-CM | POA: Diagnosis present

## 2019-08-24 LAB — CBC
HCT: 38 % (ref 36.0–46.0)
Hemoglobin: 12.3 g/dL (ref 12.0–15.0)
MCH: 28.1 pg (ref 26.0–34.0)
MCHC: 32.4 g/dL (ref 30.0–36.0)
MCV: 86.8 fL (ref 80.0–100.0)
Platelets: 252 10*3/uL (ref 150–400)
RBC: 4.38 MIL/uL (ref 3.87–5.11)
RDW: 13.1 % (ref 11.5–15.5)
WBC: 5.1 10*3/uL (ref 4.0–10.5)
nRBC: 0 % (ref 0.0–0.2)

## 2019-08-24 LAB — COMPREHENSIVE METABOLIC PANEL
ALT: 36 U/L (ref 0–44)
AST: 61 U/L — ABNORMAL HIGH (ref 15–41)
Albumin: 3.4 g/dL — ABNORMAL LOW (ref 3.5–5.0)
Alkaline Phosphatase: 38 U/L (ref 38–126)
Anion gap: 13 (ref 5–15)
BUN: 13 mg/dL (ref 6–20)
CO2: 23 mmol/L (ref 22–32)
Calcium: 7.9 mg/dL — ABNORMAL LOW (ref 8.9–10.3)
Chloride: 99 mmol/L (ref 98–111)
Creatinine, Ser: 0.89 mg/dL (ref 0.44–1.00)
GFR calc Af Amer: >60 mL/min (ref >60)
GFR calc non Af Amer: >60 mL/min (ref >60)
Glucose, Bld: 125 mg/dL — ABNORMAL HIGH (ref 70–99)
Potassium: 3.1 mmol/L — ABNORMAL LOW (ref 3.5–5.1)
Sodium: 135 mmol/L (ref 135–145)
Total Bilirubin: 1 mg/dL (ref 0.3–1.2)
Total Protein: 7.2 g/dL (ref 6.5–8.1)

## 2019-08-24 LAB — URINALYSIS, ROUTINE W REFLEX MICROSCOPIC
Bacteria, UA: NONE SEEN
Bilirubin Urine: NEGATIVE
Glucose, UA: NEGATIVE mg/dL
Ketones, ur: 20 mg/dL — AB
Leukocytes,Ua: NEGATIVE
Nitrite: NEGATIVE
Protein, ur: 30 mg/dL — AB
Specific Gravity, Urine: 1.02 (ref 1.005–1.030)
pH: 6 (ref 5.0–8.0)

## 2019-08-24 LAB — ABO/RH: ABO/RH(D): A POS

## 2019-08-24 LAB — D-DIMER, QUANTITATIVE: D-Dimer, Quant: 1.39 ug/mL-FEU — ABNORMAL HIGH (ref 0.00–0.50)

## 2019-08-24 LAB — FIBRINOGEN: Fibrinogen: 492 mg/dL — ABNORMAL HIGH (ref 210–475)

## 2019-08-24 LAB — FERRITIN: Ferritin: 166 ng/mL (ref 11–307)

## 2019-08-24 LAB — LACTIC ACID, PLASMA: Lactic Acid, Venous: 1.1 mmol/L (ref 0.5–1.9)

## 2019-08-24 LAB — TRIGLYCERIDES: Triglycerides: 180 mg/dL — ABNORMAL HIGH (ref <150)

## 2019-08-24 LAB — PREGNANCY, URINE: Preg Test, Ur: NEGATIVE

## 2019-08-24 LAB — LACTATE DEHYDROGENASE: LDH: 613 U/L — ABNORMAL HIGH (ref 98–192)

## 2019-08-24 LAB — C-REACTIVE PROTEIN: CRP: 10.3 mg/dL — ABNORMAL HIGH (ref <1.0)

## 2019-08-24 LAB — PROCALCITONIN: Procalcitonin: <0.1 ng/mL

## 2019-08-24 MED ORDER — POLYETHYLENE GLYCOL 3350 17 G PO PACK: 17.0000 g | PACK | Freq: Every day | ORAL | Status: DC | PRN

## 2019-08-24 MED ORDER — SODIUM CHLORIDE 0.9 % IV SOLN: 200.0000 mg | Freq: Once | INTRAVENOUS | Status: DC

## 2019-08-24 MED ORDER — ZINC SULFATE 220 (50 ZN) MG PO CAPS
220.0000 mg | ORAL_CAPSULE | Freq: Every day | ORAL | Status: DC
  Administered 2019-08-25 – 2019-08-27 (×3): 220 mg via ORAL
  Filled 2019-08-24 (×3): qty 1

## 2019-08-24 MED ORDER — POTASSIUM CHLORIDE 10 MEQ/100ML IV SOLN
10.0000 meq | Freq: Once | INTRAVENOUS | Status: AC
  Administered 2019-08-24: 10 meq via INTRAVENOUS
  Filled 2019-08-24: qty 100

## 2019-08-24 MED ORDER — HYDROCOD POLST-CPM POLST ER 10-8 MG/5ML PO SUER
5.0000 mL | Freq: Two times a day (BID) | ORAL | Status: DC | PRN
  Administered 2019-08-26: 5 mL via ORAL
  Filled 2019-08-24 (×3): qty 5

## 2019-08-24 MED ORDER — SODIUM CHLORIDE 0.9 % IV SOLN
100.0000 mg | Freq: Every day | INTRAVENOUS | Status: DC
  Administered 2019-08-25 – 2019-08-27 (×3): 100 mg via INTRAVENOUS
  Filled 2019-08-24 (×3): qty 20

## 2019-08-24 MED ORDER — ONDANSETRON HCL 4 MG PO TABS
4.0000 mg | ORAL_TABLET | Freq: Once | ORAL | Status: AC
  Administered 2019-08-24: 4 mg via ORAL

## 2019-08-24 MED ORDER — SODIUM CHLORIDE 0.9 % IV BOLUS
1000.0000 mL | Freq: Once | INTRAVENOUS | Status: AC
  Administered 2019-08-24: 1000 mL via INTRAVENOUS

## 2019-08-24 MED ORDER — ONDANSETRON 4 MG PO TBDP: 4.0000 mg | ORAL_TABLET | Freq: Once | ORAL | Status: DC | PRN

## 2019-08-24 MED ORDER — POTASSIUM CHLORIDE CRYS ER 20 MEQ PO TBCR
40.0000 meq | EXTENDED_RELEASE_TABLET | Freq: Two times a day (BID) | ORAL | Status: DC
  Administered 2019-08-24 – 2019-08-25 (×3): 40 meq via ORAL
  Filled 2019-08-24 (×4): qty 2

## 2019-08-24 MED ORDER — SODIUM CHLORIDE 0.9 % IV SOLN: 100.0000 mg | Freq: Every day | INTRAVENOUS | Status: DC

## 2019-08-24 MED ORDER — DEXAMETHASONE 4 MG PO TABS
6.0000 mg | ORAL_TABLET | ORAL | Status: DC
  Administered 2019-08-24: 6 mg via ORAL
  Filled 2019-08-24: qty 2

## 2019-08-24 MED ORDER — GUAIFENESIN-DM 100-10 MG/5ML PO SYRP: 10.0000 mL | ORAL_SOLUTION | ORAL | Status: DC | PRN

## 2019-08-24 MED ORDER — ENOXAPARIN SODIUM 40 MG/0.4ML ~~LOC~~ SOLN
40.0000 mg | Freq: Every day | SUBCUTANEOUS | Status: DC
  Administered 2019-08-25 – 2019-08-26 (×3): 40 mg via SUBCUTANEOUS
  Filled 2019-08-24 (×3): qty 0.4

## 2019-08-24 MED ORDER — ASCORBIC ACID 500 MG PO TABS
500.0000 mg | ORAL_TABLET | Freq: Every day | ORAL | Status: DC
  Administered 2019-08-25 – 2019-08-27 (×3): 500 mg via ORAL
  Filled 2019-08-24 (×3): qty 1

## 2019-08-24 MED ORDER — ACETAMINOPHEN 500 MG PO TABS
1000.0000 mg | ORAL_TABLET | Freq: Once | ORAL | Status: AC
  Administered 2019-08-24: 1000 mg via ORAL

## 2019-08-24 MED ORDER — ONDANSETRON HCL 4 MG/2ML IJ SOLN
4.0000 mg | Freq: Four times a day (QID) | INTRAMUSCULAR | Status: DC | PRN
  Administered 2019-08-26: 4 mg via INTRAVENOUS
  Filled 2019-08-24 (×2): qty 2

## 2019-08-24 MED ORDER — ONDANSETRON HCL 4 MG PO TABS: 4.0000 mg | ORAL_TABLET | Freq: Four times a day (QID) | ORAL | Status: DC | PRN

## 2019-08-24 MED ORDER — SODIUM CHLORIDE 0.9 % IV SOLN
100.0000 mg | INTRAVENOUS | Status: AC
  Administered 2019-08-24 (×2): 100 mg via INTRAVENOUS
  Filled 2019-08-24 (×2): qty 20

## 2019-08-24 MED ORDER — ALBUTEROL SULFATE HFA 108 (90 BASE) MCG/ACT IN AERS
10.0000 | INHALATION_SPRAY | RESPIRATORY_TRACT | Status: DC
  Administered 2019-08-24 – 2019-08-25 (×3): 10 via RESPIRATORY_TRACT
  Filled 2019-08-24 (×2): qty 6.7

## 2019-08-24 NOTE — H&P (Signed)
History and Physical  [name] YIF:[id number] DOB: 2000-04-30 DOA: 08/24/2019  Referring physician: Pauline Aus, PA-C, EDP PCP: Patient, No Pcp Per  Outpatient Specialists: none  Patient Coming From: home  Chief Complaint: SOB  HPI: [name] is a 19 y.o. female with a history of obesity, mild persistent asthma.  Patient seen for increasing shortness of breath.  She had been diagnosed with COVID on 8/9.  She had not received the Covid vaccine because she was nervous about the potential problems.  She had been exposed a few days before.  She began developing symptoms approximately 3 days ago.  Her breathing worsened particularly today.  No palliating or provoking factors.  She has been coughing without sputum production.  Dyspneic to a few feet.  Was offered bamlanivimab, but she was unable to get to the infusion center.  Emergency Department Course: CRP 10.3. On [place] 2L with saturation at 92%. Was initially mid 78s.  White count normal.  1  Review of Systems:   Pt denies any fevers, chills, nausea, vomiting, diarrhea, constipation, abdominal pain, palpitations, headache, vision changes, lightheadedness, dizziness, melena, rectal bleeding.  Review of systems are otherwise negative  Past Medical History:  Diagnosis Date  . Asthma   . Eczema   . Obesity    Past Surgical History:  Procedure Laterality Date  . FOOT SURGERY     bilateral arch repair   Social History:  reports that she is a non-smoker but has been exposed to tobacco smoke. She has never used smokeless tobacco. She reports current alcohol use. She reports current drug use. Patient lives at home  Allergies  Allergen Reactions  . Hydrocortisone-Emollient Hives    Family History  Problem Relation Age of Onset  . Healthy Mother       Prior to Admission medications   Medication Sig Start Date End Date Taking? Authorizing Provider  EPINEPHrine (EPIPEN 2-PAK) 0.3 mg/0.3 mL IJ SOAJ injection Inject 0.3  mLs (0.3 mg total) into the muscle as needed for anaphylaxis. Patient not taking: Reported on 08/24/2019 08/04/18   Gilda Crease, MD  albuterol [facility] HFA) [phone] Base) MCG/ACT inhaler 2 puffs every 4 to 6 hours as needed for wheezing or cough Patient not taking: Reported on 04/24/2019 01/17/18 08/19/19  Dione Booze, MD    Physical Exam: BP 110/70   Pulse 82   Temp 99.7 F (37.6 C) (Oral)   Resp 19   Ht 5\' 6"  (1.676 m)   Wt 115.7 kg   SpO2 96%   BMI 41.16 kg/m   . General: Young female. Awake and alert and oriented x3. No acute cardiopulmonary distress.  HEENT: Normocephalic atraumatic.  Right and left ears normal in appearance.  Pupils equal, round, reactive to light. Extraocular muscles are intact. Sclerae anicteric and noninjected.  Moist mucosal membranes. No mucosal lesions.  . Neck: Neck supple without lymphadenopathy. No carotid bruits. No masses palpated.  . Cardiovascular: Regular rate with normal S1-S2 sounds. No murmurs, rubs, gallops auscultated. No JVD.  Marland Kitchen Respiratory: Diffuse rales and rhonchi with moderate respiratory effort. No accessory muscle use. . Abdomen: Soft, nontender, nondistended. Active bowel sounds. No masses or hepatosplenomegaly  . Skin: No rashes, lesions, or ulcerations.  Dry, warm to touch. 2+ dorsalis pedis and radial pulses. . Musculoskeletal: No calf or leg pain. All major joints not erythematous nontender.  No upper or lower joint deformation.  Good ROM.  No contractures  . Psychiatric: Intact judgment and insight. Pleasant  and cooperative. . Neurologic: No focal neurological deficits. Strength is 5/5 and symmetric in upper and lower extremities.  Cranial nerves II through XII are grossly intact.           Labs on Admission: I have personally reviewed following labs and imaging studies  CBC: Recent Labs  Lab 08/24/19 1459  WBC 5.1  HGB 12.3  HCT 38.0  MCV 86.8  PLT 252   Basic Metabolic Panel: Recent Labs  Lab 08/24/19 1459    NA 135  K 3.1*  CL 99  CO2 23  GLUCOSE 125*  BUN 13  CREATININE 0.89  CALCIUM 7.9*   GFR: Estimated Creatinine Clearance: 130.4 mL/min (by C-G formula based on SCr of 0.89 mg/dL). Liver Function Tests: Recent Labs  Lab 08/24/19 1459  AST 61*  ALT 36  ALKPHOS 38  BILITOT 1.0  PROT 7.2  ALBUMIN 3.4*   No results for input(s): LIPASE, AMYLASE in the last 168 hours. No results for input(s): AMMONIA in the last 168 hours. Coagulation Profile: No results for input(s): INR, PROTIME in the last 168 hours. Cardiac Enzymes: No results for input(s): CKTOTAL, CKMB, CKMBINDEX, TROPONINI in the last 168 hours. BNP (last 3 results) No results for input(s): PROBNP in the last 8760 hours. HbA1C: No results for input(s): HGBA1C in the last 72 hours. CBG: No results for input(s): GLUCAP in the last 168 hours. Lipid Profile: Recent Labs    08/24/19 1459  TRIG 180*   Thyroid Function Tests: No results for input(s): TSH, T4TOTAL, FREET4, T3FREE, THYROIDAB in the last 72 hours. Anemia Panel: Recent Labs    08/24/19 1459  FERRITIN 166   Urine analysis:    Component Value Date/Time   COLORURINE YELLOW 04/24/2019 1924   APPEARANCEUR CLEAR 04/24/2019 1924   LABSPEC 1.024 04/24/2019 1924   PHURINE 7.0 04/24/2019 1924   GLUCOSEU NEGATIVE 04/24/2019 1924   HGBUR NEGATIVE 04/24/2019 1924   BILIRUBINUR NEGATIVE 04/24/2019 1924   KETONESUR NEGATIVE 04/24/2019 1924   PROTEINUR NEGATIVE 04/24/2019 1924   NITRITE NEGATIVE 04/24/2019 1924   LEUKOCYTESUR NEGATIVE 04/24/2019 1924   Sepsis Labs: @LABRCNTIP (procalcitonin:4,lacticidven:4) ) Recent Results (from the past 240 hour(s))  SARS Coronavirus 2 by RT PCR (hospital order, performed in [facility] hospital lab) Nasopharyngeal Nasopharyngeal Swab     Status: Abnormal   Collection Time: 08/19/19  9:51 AM   Specimen: Nasopharyngeal Swab  Result Value Ref Range Status   SARS Coronavirus 2 POSITIVE (A) NEGATIVE Final    Comment:  CRITICAL RESULT CALLED TO, READ BACK BY AND VERIFIED WITH: SUSAN OWENS,RN @1052  08/09/2021KAY (NOTE) SARS-CoV-2 target nucleic acids are DETECTED  SARS-CoV-2 RNA is generally detectable in upper respiratory specimens  during the acute phase of infection.  Positive results are indicative  of the presence of the identified virus, but do not rule out bacterial infection or co-infection with other pathogens not detected by the test.  Clinical correlation with patient history and  other diagnostic information is necessary to determine patient infection status.  The expected result is negative.  Fact Sheet for Patients:     Fact Sheet for Healthcare Providers:   10/19/2019    This test is not yet approved or cleared by the [employer] FDA and  has been authorized for detection and/or diagnosis of SARS-CoV-2 by FDA under an Emergency Use Authorization (EUA).  This EUA will remain in effect (meanin g this test can be used) for the duration of  the COVID-19 declaration under Section 564(b)(1) of  the Act, 21 U.S.C. section 360-bbb-3(b)(1), unless the authorization is terminated or revoked sooner.  Performed at [facility], [address] [place], [place] [address]   Blood Culture (routine x 2)     Status: None (Preliminary result)   Collection Time: 08/24/19  5:59 PM   Specimen: Left Antecubital; Blood  Result Value Ref Range Status   Specimen Description LEFT ANTECUBITAL  Final   Special Requests   Final    BOTTLES DRAWN AEROBIC AND ANAEROBIC Blood Culture adequate volume Performed at [facility], [address] [place], [place] [address]    Culture PENDING  Incomplete   Report Status PENDING  Incomplete  Blood Culture (routine x 2)     Status: None (Preliminary result)   Collection Time: 08/24/19  5:59 PM   Specimen: BLOOD RIGHT HAND  Result Value Ref Range Status   Specimen Description BLOOD RIGHT HAND   Final   Special Requests   Final    BOTTLES DRAWN AEROBIC AND ANAEROBIC Blood Culture adequate volume Performed at [facility], [address], [place], [place] [address]    Culture PENDING  Incomplete   Report Status PENDING  Incomplete     Radiological Exams on Admission: DG Chest Port 1 View  Result Date: 08/24/2019 CLINICAL DATA:  COVID-19 positive 08/19/2019. EXAM: PORTABLE CHEST 1 VIEW COMPARISON:  08/20/2019 FINDINGS: Lungs are hypoinflated with bilateral patchy airspace opacification compatible with multifocal pneumonia. No effusion. Cardiomediastinal silhouette and remainder of the exam is unchanged. IMPRESSION: Bilateral patchy multifocal airspace process compatible with multifocal pneumonia. Electronically Signed   By: Elberta Fortis M.D.   On: 08/24/2019 18:02    Assessment/Plan: Principal Problem:   Acute respiratory failure with hypoxia (HCC) Active Problems:   Obesity, Class III, BMI 40-49.9 (morbid obesity) (HCC)   Mild intermittent asthma, uncomplicated   Acute respiratory disease due to COVID-19 virus    This patient was discussed with the ED physician, including pertinent vitals, physical exam findings, labs, and imaging.  We also discussed care given by the ED provider.  1. Acute respiratory failure with hypoxia 2. Acute respiratory disease secondary to Covid Daily labs: CBC, CMP, CRP, D-dimer, ferritin, magnesium, phosphorus Discussed use of steroids: Solu-Medrol, remdesivir Proning Supplemental oxygen Zinc and vitamin C Albuterol HFA as needed Antitussives 3. Mild intermittent asthma a. Albuterol HFA  DVT prophylaxis: lovenox Consultants: none Code Status: full Family Communication: none  Disposition Plan: Patient should be able to return home following admission   Levie Heritage, DO

## 2019-08-24 NOTE — ED Provider Notes (Signed)
   Patient signed out to me by Langston Masker, PA-C at end of shift.  Pending consult with hospitalist for admission.  Patient Covid positive, developed symptoms on 08/16/2019 + testing done [facility] on [facility].  Here with continuing generalized body aches, nausea vomiting and cough.  Hypoxic on arrival at O2 sat of 86% on room air.  Stable here on O2 sat 97% on 2 L oxygen by nasal cannula.  Covid related pneumonia on chest x-ray.  Mild hypokalemia, potassium given here.  Patient will need admission for hypoxia  Consulted hospitalist, Dr. Adrian Blackwater who agrees to admit   Pauline Aus, PA-C 08/24/19 1945    Derwood Kaplan, MD 08/24/19 [phone]

## 2019-08-24 NOTE — ED Provider Notes (Addendum)
[facility] EMERGENCY DEPARTMENT Provider Note   CSN: [id number] Arrival date & time: 08/24/19  1306     History Chief Complaint  Patient presents with  . covid    [name] is a 19 y.o. female.  Pt has been sick since 8/6.  Pt was diagnosed on 8/9 with Covid.  Pt reports cough, short of breath.  Pt states she has not been able to eat or drink.  Pt complains of nausea and vomitting.    The history is provided by the patient. No language interpreter was used.  Emesis Severity:  Moderate Duration:  1 week Timing:  Constant Progression:  Worsening Chronicity:  New Relieved by:  Nothing Worsened by:  Nothing Ineffective treatments:  None tried Associated symptoms: cough and fever        Past Medical History:  Diagnosis Date  . Asthma   . Eczema   . Obesity     Patient Active Problem List   Diagnosis Date Noted  . Obesity peds (BMI >=95 percentile) 04/25/2016  . Allergic conjunctivitis of both eyes 04/25/2016  . Onychomycosis 04/25/2016  . Chronic pain of right knee 04/25/2016  . Eczema 04/25/2016  . Mild intermittent asthma, uncomplicated 04/25/2016    Past Surgical History:  Procedure Laterality Date  . FOOT SURGERY     bilateral arch repair     OB History    Gravida  0   Para  0   Term  0   Preterm  0   AB  0   Living  0     SAB  0   TAB  0   Ectopic  0   Multiple  0   Live Births  0           Family History  Problem Relation Age of Onset  . Healthy Mother     Social History   Tobacco Use  . Smoking status: Passive Smoke Exposure - Never Smoker  . Smokeless tobacco: Never Used  Vaping Use  . Vaping Use: Every day  . Substances: Nicotine, Mixture of cannabinoids  Substance Use Topics  . Alcohol use: Yes    Comment: occ  . Drug use: Yes    Home Medications Prior to Admission medications   Medication Sig Start Date End Date Taking? Authorizing Provider  EPINEPHrine (EPIPEN 2-PAK) 0.3 mg/0.3 mL IJ SOAJ  injection Inject 0.3 mLs (0.3 mg total) into the muscle as needed for anaphylaxis. Patient not taking: Reported on 08/24/2019 08/04/18   Gilda Crease, MD  naproxen (NAPROSYN) 500 MG tablet Take 1 tablet (500 mg total) by mouth 2 (two) times daily. Patient not taking: Reported on 08/24/2019 07/16/19   Geoffery Lyons, MD  traMADol (ULTRAM) 50 MG tablet Take 1 tablet (50 mg total) by mouth every 6 (six) hours as needed. Patient not taking: Reported on 08/24/2019 07/16/19   Geoffery Lyons, MD  albuterol [facility] HFA) [phone] Base) MCG/ACT inhaler 2 puffs every 4 to 6 hours as needed for wheezing or cough Patient not taking: Reported on 04/24/2019 01/17/18 08/19/19  Dione Booze, MD    Allergies    Hydrocortisone-emollient  Review of Systems   Review of Systems  Constitutional: Positive for fever.  Respiratory: Positive for cough.   Gastrointestinal: Positive for vomiting.  All other systems reviewed and are negative.   Physical Exam Updated Vital Signs BP 101/64 (BP Location: Left Arm)   Pulse 85   Temp 99.7 F (37.6 C) (Oral)  Resp (!) 25   Ht 5\' 6"  (1.676 m)   Wt 115.7 kg   SpO2 97%   BMI 41.16 kg/m   Physical Exam Vitals and nursing note reviewed.  Constitutional:      Appearance: She is well-developed.  HENT:     Head: Normocephalic.     Nose: Nose normal.  Cardiovascular:     Rate and Rhythm: Normal rate.  Pulmonary:     Effort: Pulmonary effort is normal.  Abdominal:     General: Abdomen is flat. There is no distension.     Palpations: Abdomen is soft.  Musculoskeletal:        General: Normal range of motion.     Cervical back: Normal range of motion.  Skin:    General: Skin is warm.  Neurological:     General: No focal deficit present.     Mental Status: She is alert and oriented to person, place, and time.  Psychiatric:        Mood and Affect: Mood normal.     ED Results / Procedures / Treatments   Labs (all labs ordered are listed, but only abnormal  results are displayed) Labs Reviewed  COMPREHENSIVE METABOLIC PANEL - Abnormal; Notable for the following components:      Result Value   Potassium 3.1 (*)    Glucose, Bld 125 (*)    Calcium 7.9 (*)    Albumin 3.4 (*)    AST 61 (*)    All other components within normal limits  CULTURE, BLOOD (ROUTINE X 2)  CULTURE, BLOOD (ROUTINE X 2)  CBC  URINALYSIS, ROUTINE W REFLEX MICROSCOPIC  LACTIC ACID, PLASMA  LACTIC ACID, PLASMA  D-DIMER, QUANTITATIVE (NOT AT [facility])  PROCALCITONIN  LACTATE DEHYDROGENASE  FERRITIN  TRIGLYCERIDES  FIBRINOGEN  C-REACTIVE PROTEIN  POC URINE PREG, ED    EKG None  Radiology DG Chest Port 1 View  Result Date: 08/24/2019 CLINICAL DATA:  COVID-19 positive 08/19/2019. EXAM: PORTABLE CHEST 1 VIEW COMPARISON:  08/20/2019 FINDINGS: Lungs are hypoinflated with bilateral patchy airspace opacification compatible with multifocal pneumonia. No effusion. Cardiomediastinal silhouette and remainder of the exam is unchanged. IMPRESSION: Bilateral patchy multifocal airspace process compatible with multifocal pneumonia. Electronically Signed   By: 10/20/2019 M.D.   On: 08/24/2019 18:02    Procedures Procedures (including critical care time)  Medications Ordered in ED Medications  acetaminophen (TYLENOL) tablet 1,000 mg (1,000 mg Oral Given 08/24/19 1447)  ondansetron (ZOFRAN) tablet 4 mg (4 mg Oral Given 08/24/19 1448)    ED Course  I have reviewed the triage vital signs and the nursing notes.  Pertinent labs & imaging results that were available during my care of the patient were reviewed by me and considered in my medical decision making (see chart for details).    MDM Rules/Calculators/A&P                          MDM:  Pt's 02 sats 86% on room air.  Improved to 97% on 2 liters.  Chest xray shows multifocal pneumonia.  Consult hospitalist to admit  Final Clinical Impression(s) / ED Diagnoses Final diagnoses:  Pneumonia due to COVID-19 virus    Rx / DC  Orders ED Discharge Orders    None       08/26/19, Elson Areas 08/24/19 1841    08/26/19, PA-C 08/24/19 1842    08/26/19, MD 08/24/19 [phone]

## 2019-08-24 NOTE — ED Triage Notes (Signed)
Pt was covid + on 89/21. Cough, bodyaches, and n/v since. 86% on room air.

## 2019-08-25 ENCOUNTER — Inpatient Hospital Stay (HOSPITAL_COMMUNITY): Payer: HRSA Program

## 2019-08-25 LAB — D-DIMER, QUANTITATIVE: D-Dimer, Quant: 2.01 ug/mL-FEU — ABNORMAL HIGH (ref 0.00–0.50)

## 2019-08-25 LAB — COMPREHENSIVE METABOLIC PANEL
ALT: 34 U/L (ref 0–44)
AST: 52 U/L — ABNORMAL HIGH (ref 15–41)
Albumin: 3.1 g/dL — ABNORMAL LOW (ref 3.5–5.0)
Alkaline Phosphatase: 34 U/L — ABNORMAL LOW (ref 38–126)
Anion gap: 12 (ref 5–15)
BUN: 10 mg/dL (ref 6–20)
CO2: 22 mmol/L (ref 22–32)
Calcium: 7.7 mg/dL — ABNORMAL LOW (ref 8.9–10.3)
Chloride: 104 mmol/L (ref 98–111)
Creatinine, Ser: 0.73 mg/dL (ref 0.44–1.00)
GFR calc Af Amer: >60 mL/min (ref >60)
GFR calc non Af Amer: >60 mL/min (ref >60)
Glucose, Bld: 119 mg/dL — ABNORMAL HIGH (ref 70–99)
Potassium: 3.9 mmol/L (ref 3.5–5.1)
Sodium: 138 mmol/L (ref 135–145)
Total Bilirubin: 0.7 mg/dL (ref 0.3–1.2)
Total Protein: 6.6 g/dL (ref 6.5–8.1)

## 2019-08-25 LAB — CBC WITH DIFFERENTIAL/PLATELET
Abs Immature Granulocytes: 0.01 10*3/uL (ref 0.00–0.07)
Basophils Absolute: 0 10*3/uL (ref 0.0–0.1)
Basophils Relative: 0 %
Eosinophils Absolute: 0 10*3/uL (ref 0.0–0.5)
Eosinophils Relative: 0 %
HCT: 35.3 % — ABNORMAL LOW (ref 36.0–46.0)
Hemoglobin: 10.9 g/dL — ABNORMAL LOW (ref 12.0–15.0)
Immature Granulocytes: 0 %
Lymphocytes Relative: 11 %
Lymphs Abs: 0.6 10*3/uL — ABNORMAL LOW (ref 0.7–4.0)
MCH: 27.7 pg (ref 26.0–34.0)
MCHC: 30.9 g/dL (ref 30.0–36.0)
MCV: 89.6 fL (ref 80.0–100.0)
Monocytes Absolute: 0.2 10*3/uL (ref 0.1–1.0)
Monocytes Relative: 4 %
Neutro Abs: 4.1 10*3/uL (ref 1.7–7.7)
Neutrophils Relative %: 85 %
Platelets: 255 10*3/uL (ref 150–400)
RBC: 3.94 MIL/uL (ref 3.87–5.11)
RDW: 12.7 % (ref 11.5–15.5)
WBC: 4.9 10*3/uL (ref 4.0–10.5)
nRBC: 0 % (ref 0.0–0.2)

## 2019-08-25 LAB — FERRITIN: Ferritin: 147 ng/mL (ref 11–307)

## 2019-08-25 LAB — PHOSPHORUS: Phosphorus: 3 mg/dL (ref 2.5–4.6)

## 2019-08-25 LAB — MAGNESIUM: Magnesium: 2.3 mg/dL (ref 1.7–2.4)

## 2019-08-25 LAB — HIV ANTIBODY (ROUTINE TESTING W REFLEX): HIV Screen 4th Generation wRfx: NONREACTIVE

## 2019-08-25 LAB — C-REACTIVE PROTEIN: CRP: 10.4 mg/dL — ABNORMAL HIGH (ref <1.0)

## 2019-08-25 MED ORDER — METHYLPREDNISOLONE SODIUM SUCC 125 MG IJ SOLR
60.0000 mg | Freq: Two times a day (BID) | INTRAMUSCULAR | Status: DC
  Administered 2019-08-25 – 2019-08-27 (×5): 60 mg via INTRAVENOUS
  Filled 2019-08-25 (×5): qty 2

## 2019-08-25 MED ORDER — ALBUTEROL SULFATE HFA 108 (90 BASE) MCG/ACT IN AERS
10.0000 | INHALATION_SPRAY | Freq: Four times a day (QID) | RESPIRATORY_TRACT | Status: DC
  Administered 2019-08-25 – 2019-08-27 (×10): 10 via RESPIRATORY_TRACT

## 2019-08-25 MED ORDER — IOHEXOL 350 MG/ML SOLN
100.0000 mL | Freq: Once | INTRAVENOUS | Status: AC | PRN
  Administered 2019-08-25: 100 mL via INTRAVENOUS

## 2019-08-25 NOTE — ED Notes (Signed)
Patient educated to take deep breaths through her nose and to use her incentive spirometer 10 times an hour.

## 2019-08-25 NOTE — Progress Notes (Signed)
PROGRESS NOTE  Dyani Babel Kotlarz VXB:[id number] DOB: 06/22/2000 DOA: 08/24/2019 PCP: Patient, No Pcp Per  Brief History:  19 year old female with a history of asthma and obesity presenting with approximately 10-day history of myalgias, arthralgias, and nonproductive cough and shortness of breath.  The patient states that her symptoms began around 08/16/2019.  She was seen in the emergency department on 08/19/2019.  She was tested Covid positive at that time.  She was sent home in stable condition with referral to outpatient infusion clinic to receive Regen-CoV.  However, the patient declined.  The patient's symptoms progressed with continued shortness of breath, coughing, myalgias.  As result, the patient presented the emergency department for further evaluation.  She was noted to have a low-grade temperature of 99.7 F.  She was tachycardic into the 110s.  Oxygen saturation was noted to be 86% on room air.  The patient was started on IV steroids and remdesivir.  Assessment/Plan: Acute respiratory failure with hypoxia secondary to COVID-19 pneumonia -Continue IV remdesivir and steroids -CRP 10.3>> 10.4 -Ferritin 166>>147 -D-dimer 1.39>>2.01 -CT angiogram chest -Continue vitamin C and zinc -Procalcitonin <0.10 -Personally reviewed chest x-ray--bilateral patchy infiltrates  Morbid obesity -BMI 41.16 -Lifestyle modification  Hypokalemia -Replete -Check magnesium--2.3  Mild intermittent asthma -No wheezing presently  Leg pain -venous duples     Status is: Inpatient  Remains inpatient appropriate because:IV treatments appropriate due to intensity of illness or inability to take PO   Dispo: The patient is from: Home              Anticipated d/c is to: Home              Anticipated d/c date is: 2 days              Patient currently is not medically stable to d/c.        Family Communication:   No Family at bedside  Consultants:  none  Code Status:  FULL   DVT  Prophylaxis:   [place] Lovenox   Procedures: As Listed in Progress Note Above  Antibiotics: None       Subjective: Patient continues to have some sob, not much better than yesterday.  Denies f/c, cp.  Had n/v without diarrhea.  No abd pain, hematochezia, melena  Objective: Vitals:   08/25/19 0300 08/25/19 0400 08/25/19 0500 08/25/19 0700  BP:  (!) 113/55 118/61 108/63  Pulse: 90 (!) 101 92 79  Resp:  20 20 20   Temp:      TempSrc:      SpO2: 94% 95% 93% 95%  Weight:      Height:        Intake/Output Summary (Last 24 hours) at 08/25/2019 0936 Last data filed at 08/24/2019 2332 Gross per 24 hour  Intake 100 ml  Output --  Net 100 ml   Weight change:  Exam:   General:  Pt is alert, follows commands appropriately, not in acute distress  HEENT: No icterus, No thrush, No neck mass, [place]/AT  Cardiovascular: RRR, S1/S2, no rubs, no gallops  Respiratory: bibasilar crackles. No wheeze  Abdomen: Soft/+BS, non tender, non distended, no guarding  Extremities: No edema, No lymphangitis, No petechiae, No rashes, no synovitis   Data Reviewed: I have personally reviewed following labs and imaging studies Basic Metabolic Panel: Recent Labs  Lab 08/24/19 1459 08/25/19 0626  NA 135  --   K 3.1*  --   CL 99  --  CO2 23  --   GLUCOSE 125*  --   BUN 13  --   CREATININE 0.89  --   CALCIUM 7.9*  --   MG  --  2.3  PHOS  --  3.0   Liver Function Tests: Recent Labs  Lab 08/24/19 1459  AST 61*  ALT 36  ALKPHOS 38  BILITOT 1.0  PROT 7.2  ALBUMIN 3.4*   No results for input(s): LIPASE, AMYLASE in the last 168 hours. No results for input(s): AMMONIA in the last 168 hours. Coagulation Profile: No results for input(s): INR, PROTIME in the last 168 hours. CBC: Recent Labs  Lab 08/24/19 1459 08/25/19 0626  WBC 5.1 4.9  NEUTROABS  --  4.1  HGB 12.3 10.9*  HCT 38.0 35.3*  MCV 86.8 89.6  PLT 252 255   Cardiac Enzymes: No results for input(s): CKTOTAL, CKMB,  CKMBINDEX, TROPONINI in the last 168 hours. BNP: Invalid input(s): POCBNP CBG: No results for input(s): GLUCAP in the last 168 hours. HbA1C: No results for input(s): HGBA1C in the last 72 hours. Urine analysis:    Component Value Date/Time   COLORURINE YELLOW 08/24/2019 2050   APPEARANCEUR HAZY (A) 08/24/2019 2050   LABSPEC 1.020 08/24/2019 2050   PHURINE 6.0 08/24/2019 2050   GLUCOSEU NEGATIVE 08/24/2019 2050   HGBUR LARGE (A) 08/24/2019 2050   BILIRUBINUR NEGATIVE 08/24/2019 2050   KETONESUR 20 (A) 08/24/2019 2050   PROTEINUR 30 (A) 08/24/2019 2050   NITRITE NEGATIVE 08/24/2019 2050   LEUKOCYTESUR NEGATIVE 08/24/2019 2050   Sepsis Labs: @LABRCNTIP (procalcitonin:4,lacticidven:4) ) Recent Results (from the past 240 hour(s))  SARS Coronavirus 2 by RT PCR (hospital order, performed in [facility] hospital lab) Nasopharyngeal Nasopharyngeal Swab     Status: Abnormal   Collection Time: 08/19/19  9:51 AM   Specimen: Nasopharyngeal Swab  Result Value Ref Range Status   SARS Coronavirus 2 POSITIVE (A) NEGATIVE Final    Comment: CRITICAL RESULT CALLED TO, READ BACK BY AND VERIFIED WITH: SUSAN OWENS,RN @1052  08/09/2021KAY (NOTE) SARS-CoV-2 target nucleic acids are DETECTED  SARS-CoV-2 RNA is generally detectable in upper respiratory specimens  during the acute phase of infection.  Positive results are indicative  of the presence of the identified virus, but do not rule out bacterial infection or co-infection with other pathogens not detected by the test.  Clinical correlation with patient history and  other diagnostic information is necessary to determine patient infection status.  The expected result is negative.  Fact Sheet for Patients:     Fact Sheet for Healthcare Providers:   10/19/2019    This test is not yet approved or cleared by the [employer] FDA and  has been authorized for detection  and/or diagnosis of SARS-CoV-2 by FDA under an Emergency Use Authorization (EUA).  This EUA will remain in effect (meanin g this test can be used) for the duration of  the COVID-19 declaration under Section 564(b)(1) of the Act, 21 U.S.C. section 360-bbb-3(b)(1), unless the authorization is terminated or revoked sooner.  Performed at [facility], [address], [place], [address] [place]   Blood Culture (routine x 2)     Status: None (Preliminary result)   Collection Time: 08/24/19  5:59 PM   Specimen: Vein; Blood  Result Value Ref Range Status   Specimen Description LEFT ANTECUBITAL  Final   Special Requests   Final    BOTTLES DRAWN AEROBIC AND ANAEROBIC Blood Culture adequate volume Performed at [facility], [address], [place],  [place] [address]    Culture PENDING  Incomplete   Report Status PENDING  Incomplete  Blood Culture (routine x 2)     Status: None (Preliminary result)   Collection Time: 08/24/19  5:59 PM   Specimen: Vein; Blood  Result Value Ref Range Status   Specimen Description BLOOD RIGHT HAND  Final   Special Requests   Final    BOTTLES DRAWN AEROBIC AND ANAEROBIC Blood Culture adequate volume Performed at [facility], [address] [place], [place] [address]    Culture PENDING  Incomplete   Report Status PENDING  Incomplete     Scheduled Meds: . albuterol  10 puff Inhalation QID  . vitamin C  500 mg Oral Daily  . dexamethasone  6 mg Oral Q24H  . enoxaparin (LOVENOX) injection  40 mg Subcutaneous QHS  . potassium chloride  40 mEq Oral BID  . zinc sulfate  220 mg Oral Daily   Continuous Infusions: . remdesivir 100 mg in NS 100 mL      Procedures/Studies: DG Chest Port 1 View  Result Date: 08/24/2019 CLINICAL DATA:  COVID-19 positive 08/19/2019. EXAM: PORTABLE CHEST 1 VIEW COMPARISON:  08/20/2019 FINDINGS: Lungs are hypoinflated with bilateral patchy airspace opacification compatible with multifocal pneumonia. No effusion. Cardiomediastinal  silhouette and remainder of the exam is unchanged. IMPRESSION: Bilateral patchy multifocal airspace process compatible with multifocal pneumonia. Electronically Signed   By: Elberta Fortis M.D.   On: 08/24/2019 18:02   DG Chest Port 1 View  Result Date: 08/19/2019 CLINICAL DATA:  Cough with aches and pains 4 days as well as fever and shortness of breath. EXAM: PORTABLE CHEST 1 VIEW COMPARISON:  04/24/2019 FINDINGS: Lungs are hypoinflated and otherwise clear. Cardiomediastinal silhouette and remainder of the exam is unchanged. IMPRESSION: Hypoinflation without acute cardiopulmonary disease. Electronically Signed   By: Elberta Fortis M.D.   On: 08/19/2019 10:53    Catarina Hartshorn, DO  [facility]  If 7PM-7AM, please contact night-coverage [email or web address] Password [facility] 08/25/2019, 9:36 AM   LOS: 1 day

## 2019-08-26 DIAGNOSIS — J1282 Pneumonia due to coronavirus disease 2019: Secondary | ICD-10-CM

## 2019-08-26 LAB — COMPREHENSIVE METABOLIC PANEL
ALT: 38 U/L (ref 0–44)
AST: 40 U/L (ref 15–41)
Albumin: 3.1 g/dL — ABNORMAL LOW (ref 3.5–5.0)
Alkaline Phosphatase: 32 U/L — ABNORMAL LOW (ref 38–126)
Anion gap: 10 (ref 5–15)
BUN: 12 mg/dL (ref 6–20)
CO2: 24 mmol/L (ref 22–32)
Calcium: 8.1 mg/dL — ABNORMAL LOW (ref 8.9–10.3)
Chloride: 105 mmol/L (ref 98–111)
Creatinine, Ser: 0.64 mg/dL (ref 0.44–1.00)
GFR calc Af Amer: >60 mL/min (ref >60)
GFR calc non Af Amer: >60 mL/min (ref >60)
Glucose, Bld: 125 mg/dL — ABNORMAL HIGH (ref 70–99)
Potassium: 4.2 mmol/L (ref 3.5–5.1)
Sodium: 139 mmol/L (ref 135–145)
Total Bilirubin: 0.6 mg/dL (ref 0.3–1.2)
Total Protein: 6.6 g/dL (ref 6.5–8.1)

## 2019-08-26 LAB — CBC WITH DIFFERENTIAL/PLATELET
Abs Immature Granulocytes: 0.04 10*3/uL (ref 0.00–0.07)
Basophils Absolute: 0 10*3/uL (ref 0.0–0.1)
Basophils Relative: 0 %
Eosinophils Absolute: 0 10*3/uL (ref 0.0–0.5)
Eosinophils Relative: 0 %
HCT: 33.3 % — ABNORMAL LOW (ref 36.0–46.0)
Hemoglobin: 10.7 g/dL — ABNORMAL LOW (ref 12.0–15.0)
Lymphocytes Relative: 16 %
Lymphs Abs: 0.8 10*3/uL (ref 0.7–4.0)
MCH: 28.2 pg (ref 26.0–34.0)
MCHC: 32.1 g/dL (ref 30.0–36.0)
MCV: 87.6 fL (ref 80.0–100.0)
Monocytes Absolute: 0.3 10*3/uL (ref 0.1–1.0)
Monocytes Relative: 6 %
Neutro Abs: 3.9 10*3/uL (ref 1.7–7.7)
Neutrophils Relative %: 78 %
Platelets: 260 10*3/uL (ref 150–400)
RBC: 3.8 MIL/uL — ABNORMAL LOW (ref 3.87–5.11)
RDW: 13.1 % (ref 11.5–15.5)
WBC: 5 10*3/uL (ref 4.0–10.5)
nRBC: 0 % (ref 0.0–0.2)

## 2019-08-26 LAB — PHOSPHORUS: Phosphorus: 2.8 mg/dL (ref 2.5–4.6)

## 2019-08-26 LAB — C-REACTIVE PROTEIN: CRP: 7.3 mg/dL — ABNORMAL HIGH (ref <1.0)

## 2019-08-26 LAB — D-DIMER, QUANTITATIVE: D-Dimer, Quant: 1.33 ug/mL-FEU — ABNORMAL HIGH (ref 0.00–0.50)

## 2019-08-26 LAB — MAGNESIUM: Magnesium: 2.5 mg/dL — ABNORMAL HIGH (ref 1.7–2.4)

## 2019-08-26 LAB — FERRITIN: Ferritin: 159 ng/mL (ref 11–307)

## 2019-08-26 NOTE — ED Notes (Signed)
Pt refused dinner tray and did not eat any of her lunch tray. Pt given water and popsicle at this time.

## 2019-08-26 NOTE — Progress Notes (Signed)
PROGRESS NOTE  [name] JSH:[id number] DOB: 2000-05-07 DOA: 08/24/2019 PCP: Patient, No Pcp Per   Brief History:  19 year old female with a history of asthma and obesity presenting with approximately 10-day history of myalgias, arthralgias, and nonproductive cough and shortness of breath.  The patient states that her symptoms began around 08/16/2019.  She was seen in the emergency department on 08/19/2019.  She was tested Covid positive at that time.  She was sent home in stable condition with referral to outpatient infusion clinic to receive Regen-CoV.  However, the patient declined.  The patient's symptoms progressed with continued shortness of breath, coughing, myalgias.  As result, the patient presented the emergency department for further evaluation.  She was noted to have a low-grade temperature of 99.7 F.  She was tachycardic into the 110s.  Oxygen saturation was noted to be 86% on room air.  The patient was started on IV steroids and remdesivir.  Assessment/Plan: Acute respiratory failure with hypoxia secondary to COVID-19 pneumonia -Continue IV remdesivir and steroids -CRP 10.3>> 10.4>>7.3 -Ferritin 166>>147>>159 -D-dimer 1.39>>2.01>>1.33 -CT angiogram chest--neg PE, extensive bilateral patchy airspace disease -Continue vitamin C and zinc -Procalcitonin <0.10 -Personally reviewed chest x-ray--bilateral patchy infiltrates -remains stable on 6L last 24 hours  Morbid obesity -BMI 41.16 -Lifestyle modification  Hypokalemia -Repleted -Check magnesium--2.3  Mild intermittent asthma -No wheezing presently  Leg pain -venous duplex--neg     Status is: Inpatient  Remains inpatient appropriate because:IV treatments appropriate due to intensity of illness or inability to take PO   Dispo: The patient is from: Home  Anticipated d/c is to: Home  Anticipated d/c date is: 2 days  Patient currently is not medically  stable to d/c.        Family Communication:   No Family at bedside  Consultants:  none  Code Status:  FULL   DVT Prophylaxis:   [place] Lovenox   Procedures: As Listed in Progress Note Above  Antibiotics: None      Subjective: Patient sleeping prone when I entered room.  She feels that her breathing is not much better, but not worse. She had an episode of vomiting and loose stool.  No hematochezia, melena, abd pain.  Objective: Vitals:   08/26/19 0400 08/26/19 0608 08/26/19 0925 08/26/19 1234  BP:  119/61    Pulse: 71 62  70  Resp:  (!) 21    Temp:      TempSrc:      SpO2: 94% 92% 93% 96%  Weight:      Height:       No intake or output data in the 24 hours ending 08/26/19 1243 Weight change:  Exam:   General:  Pt is alert, follows commands appropriately, not in acute distress  HEENT: No icterus, No thrush, No neck mass, [place]/AT  Cardiovascular: RRR, S1/S2, no rubs, no gallops  Respiratory: bibasilar crackles. No wheeze  Abdomen: Soft/+BS, non tender, non distended, no guarding  Extremities: No edema, No lymphangitis, No petechiae, No rashes, no synovitis   Data Reviewed: I have personally reviewed following labs and imaging studies Basic Metabolic Panel: Recent Labs  Lab 08/24/19 1459 08/25/19 0626 08/26/19 0355  NA 135 138 139  K 3.1* 3.9 4.2  CL 99 104 105  CO2 23 22 24   GLUCOSE 125* 119* 125*  BUN 13 10 12   CREATININE 0.89 0.73 0.64  CALCIUM 7.9* 7.7* 8.1*  MG  --  2.3 2.5*  PHOS  --  3.0 2.8   Liver Function Tests: Recent Labs  Lab 08/24/19 1459 08/25/19 0626 08/26/19 0355  AST 61* 52* 40  ALT 36 34 38  ALKPHOS 38 34* 32*  BILITOT 1.0 0.7 0.6  PROT 7.2 6.6 6.6  ALBUMIN 3.4* 3.1* 3.1*   No results for input(s): LIPASE, AMYLASE in the last 168 hours. No results for input(s): AMMONIA in the last 168 hours. Coagulation Profile: No results for input(s): INR, PROTIME in the last 168 hours. CBC: Recent Labs  Lab  08/24/19 1459 08/25/19 0626 08/26/19 0355  WBC 5.1 4.9 5.0  NEUTROABS  --  4.1 3.9  HGB 12.3 10.9* 10.7*  HCT 38.0 35.3* 33.3*  MCV 86.8 89.6 87.6  PLT 252 255 260   Cardiac Enzymes: No results for input(s): CKTOTAL, CKMB, CKMBINDEX, TROPONINI in the last 168 hours. BNP: Invalid input(s): POCBNP CBG: No results for input(s): GLUCAP in the last 168 hours. HbA1C: No results for input(s): HGBA1C in the last 72 hours. Urine analysis:    Component Value Date/Time   COLORURINE YELLOW 08/24/2019 2050   APPEARANCEUR HAZY (A) 08/24/2019 2050   LABSPEC 1.020 08/24/2019 2050   PHURINE 6.0 08/24/2019 2050   GLUCOSEU NEGATIVE 08/24/2019 2050   HGBUR LARGE (A) 08/24/2019 2050   BILIRUBINUR NEGATIVE 08/24/2019 2050   KETONESUR 20 (A) 08/24/2019 2050   PROTEINUR 30 (A) 08/24/2019 2050   NITRITE NEGATIVE 08/24/2019 2050   LEUKOCYTESUR NEGATIVE 08/24/2019 2050   Sepsis Labs: @LABRCNTIP (procalcitonin:4,lacticidven:4) ) Recent Results (from the past 240 hour(s))  SARS Coronavirus 2 by RT PCR (hospital order, performed in [facility] hospital lab) Nasopharyngeal Nasopharyngeal Swab     Status: Abnormal   Collection Time: 08/19/19  9:51 AM   Specimen: Nasopharyngeal Swab  Result Value Ref Range Status   SARS Coronavirus 2 POSITIVE (A) NEGATIVE Final    Comment: CRITICAL RESULT CALLED TO, READ BACK BY AND VERIFIED WITH: SUSAN OWENS,RN @1052  08/09/2021KAY (NOTE) SARS-CoV-2 target nucleic acids are DETECTED  SARS-CoV-2 RNA is generally detectable in upper respiratory specimens  during the acute phase of infection.  Positive results are indicative  of the presence of the identified virus, but do not rule out bacterial infection or co-infection with other pathogens not detected by the test.  Clinical correlation with patient history and  other diagnostic information is necessary to determine patient infection status.  The expected result is negative.  Fact Sheet for Patients:       Fact Sheet for Healthcare Providers:   10/19/2019    This test is not yet approved or cleared by the [employer] FDA and  has been authorized for detection and/or diagnosis of SARS-CoV-2 by FDA under an Emergency Use Authorization (EUA).  This EUA will remain in effect (meanin g this test can be used) for the duration of  the COVID-19 declaration under Section 564(b)(1) of the Act, 21 U.S.C. section 360-bbb-3(b)(1), unless the authorization is terminated or revoked sooner.  Performed at [facility], [address] [place], [address] [place]   Blood Culture (routine x 2)     Status: None (Preliminary result)   Collection Time: 08/24/19  5:59 PM   Specimen: Left Antecubital; Blood  Result Value Ref Range Status   Specimen Description LEFT ANTECUBITAL  Final   Special Requests   Final    BOTTLES DRAWN AEROBIC AND ANAEROBIC Blood Culture adequate volume   Culture   Final    NO GROWTH 2 DAYS Performed at [facility], [address] [place],  [place] [address]    Report Status PENDING  Incomplete  Blood Culture (routine x 2)     Status: None (Preliminary result)   Collection Time: 08/24/19  5:59 PM   Specimen: BLOOD RIGHT HAND  Result Value Ref Range Status   Specimen Description BLOOD RIGHT HAND  Final   Special Requests   Final    BOTTLES DRAWN AEROBIC AND ANAEROBIC Blood Culture adequate volume   Culture   Final    NO GROWTH 2 DAYS Performed at [facility], [address] [place], [place] [address]    Report Status PENDING  Incomplete     Scheduled Meds: . albuterol  10 puff Inhalation QID  . vitamin C  500 mg Oral Daily  . enoxaparin (LOVENOX) injection  40 mg Subcutaneous QHS  . methylPREDNISolone (SOLU-MEDROL) injection  60 mg Intravenous Q12H  . potassium chloride  40 mEq Oral BID  . zinc sulfate  220 mg Oral Daily   Continuous Infusions: . remdesivir 100 mg in NS 100 mL Stopped  (08/25/19 1145)    Procedures/Studies: CT ANGIO CHEST PE W OR WO CONTRAST  Result Date: 08/25/2019 CLINICAL DATA:  Shortness of breath. Elevated D-dimer. EXAM: CT ANGIOGRAPHY CHEST WITH CONTRAST TECHNIQUE: Multidetector CT imaging of the chest was performed using the standard protocol during bolus administration of intravenous contrast. Multiplanar CT image reconstructions and MIPs were obtained to evaluate the vascular anatomy. CONTRAST:  OMNIPAQUE IOHEXOL 350 MG/ML SOLN COMPARISON:  None. FINDINGS: Cardiovascular: Satisfactory opacification of the pulmonary arteries to the segmental level. No evidence of pulmonary embolism. Normal heart size. No pericardial effusion. Mediastinum/Nodes: No enlarged mediastinal, hilar, or axillary lymph nodes. Thyroid gland, trachea, and esophagus demonstrate no significant findings. Lungs/Pleura: No pleural effusion or pneumothorax. Bilateral patchy airspace disease in the upper and lower lobes with areas of normal aerated lung between the areas of airspace disease. Upper Abdomen: No acute abnormality. Musculoskeletal: No chest wall abnormality. No acute or significant osseous findings. Review of the MIP images confirms the above findings. IMPRESSION: 1. No evidence of pulmonary embolus. 2. Extensive bilateral patchy airspace disease in the upper and lower lobes as can be seen with multilobar pneumonia including atypical viral pneumonia such as COVID-19. Electronically Signed   By: Elige Ko   On: 08/25/2019 13:13   US Venous Img Lower Bilateral (DVT)  Result Date: 08/25/2019 CLINICAL DATA:  COVID-19 positivity with leg pain, initial encounter EXAM: BILATERAL LOWER EXTREMITY VENOUS DOPPLER ULTRASOUND TECHNIQUE: Gray-scale sonography with graded compression, as well as color Doppler and duplex ultrasound were performed to evaluate the lower extremity deep venous systems from the level of the common femoral vein and including the common femoral, femoral, profunda  femoral, popliteal and calf veins including the posterior tibial, peroneal and gastrocnemius veins when visible. The superficial great saphenous vein was also interrogated. Spectral Doppler was utilized to evaluate flow at rest and with distal augmentation maneuvers in the common femoral, femoral and popliteal veins. COMPARISON:  None. FINDINGS: RIGHT LOWER EXTREMITY Common Femoral Vein: No evidence of thrombus. Normal compressibility, respiratory phasicity and response to augmentation. Saphenofemoral Junction: No evidence of thrombus. Normal compressibility and flow on color Doppler imaging. Profunda Femoral Vein: No evidence of thrombus. Normal compressibility and flow on color Doppler imaging. Femoral Vein: No evidence of thrombus. Normal compressibility, respiratory phasicity and response to augmentation. Popliteal Vein: No evidence of thrombus. Normal compressibility, respiratory phasicity and response to augmentation. Calf Veins: No evidence of thrombus. Normal compressibility and flow on color Doppler imaging.  Superficial Great Saphenous Vein: No evidence of thrombus. Normal compressibility. Venous Reflux:  None. Other Findings:  None. LEFT LOWER EXTREMITY Common Femoral Vein: No evidence of thrombus. Normal compressibility, respiratory phasicity and response to augmentation. Saphenofemoral Junction: No evidence of thrombus. Normal compressibility and flow on color Doppler imaging. Profunda Femoral Vein: No evidence of thrombus. Normal compressibility and flow on color Doppler imaging. Femoral Vein: No evidence of thrombus. Normal compressibility, respiratory phasicity and response to augmentation. Popliteal Vein: No evidence of thrombus. Normal compressibility, respiratory phasicity and response to augmentation. Calf Veins: No evidence of thrombus. Normal compressibility and flow on color Doppler imaging. Superficial Great Saphenous Vein: No evidence of thrombus. Normal compressibility. Venous Reflux:  None.  Other Findings:  None. IMPRESSION: No evidence of deep venous thrombosis in either lower extremity. Electronically Signed   By: Alcide CleverMark  Lukens M.D.   On: 08/25/2019 10:57   DG Chest Port 1 View  Result Date: 08/24/2019 CLINICAL DATA:  COVID-19 positive 08/19/2019. EXAM: PORTABLE CHEST 1 VIEW COMPARISON:  08/20/2019 FINDINGS: Lungs are hypoinflated with bilateral patchy airspace opacification compatible with multifocal pneumonia. No effusion. Cardiomediastinal silhouette and remainder of the exam is unchanged. IMPRESSION: Bilateral patchy multifocal airspace process compatible with multifocal pneumonia. Electronically Signed   By: Elberta Fortisaniel  Boyle M.D.   On: 08/24/2019 18:02   DG Chest Port 1 View  Result Date: 08/19/2019 CLINICAL DATA:  Cough with aches and pains 4 days as well as fever and shortness of breath. EXAM: PORTABLE CHEST 1 VIEW COMPARISON:  04/24/2019 FINDINGS: Lungs are hypoinflated and otherwise clear. Cardiomediastinal silhouette and remainder of the exam is unchanged. IMPRESSION: Hypoinflation without acute cardiopulmonary disease. Electronically Signed   By: Elberta Fortisaniel  Boyle M.D.   On: 08/19/2019 10:53    Catarina Hartshornavid Rhiannan Kievit, DO  [facility]  If 7PM-7AM, please contact night-coverage [email or web address] Password [email or web address] 08/26/2019, 12:43 PM   LOS: 2 days

## 2019-08-27 DIAGNOSIS — J069 Acute upper respiratory infection, unspecified: Secondary | ICD-10-CM

## 2019-08-27 DIAGNOSIS — J4520 Mild intermittent asthma, uncomplicated: Secondary | ICD-10-CM

## 2019-08-27 DIAGNOSIS — J9601 Acute respiratory failure with hypoxia: Secondary | ICD-10-CM

## 2019-08-27 DIAGNOSIS — U071 COVID-19: Principal | ICD-10-CM

## 2019-08-27 LAB — CBC WITH DIFFERENTIAL/PLATELET
Abs Immature Granulocytes: 0.11 10*3/uL — ABNORMAL HIGH (ref 0.00–0.07)
Basophils Absolute: 0 10*3/uL (ref 0.0–0.1)
Basophils Relative: 0 %
Eosinophils Absolute: 0 10*3/uL (ref 0.0–0.5)
Eosinophils Relative: 0 %
HCT: 35 % — ABNORMAL LOW (ref 36.0–46.0)
Hemoglobin: 11.1 g/dL — ABNORMAL LOW (ref 12.0–15.0)
Immature Granulocytes: 2 %
Lymphocytes Relative: 13 %
Lymphs Abs: 0.8 10*3/uL (ref 0.7–4.0)
MCH: 28 pg (ref 26.0–34.0)
MCHC: 31.7 g/dL (ref 30.0–36.0)
MCV: 88.4 fL (ref 80.0–100.0)
Monocytes Absolute: 0.2 10*3/uL (ref 0.1–1.0)
Monocytes Relative: 4 %
Neutro Abs: 5 10*3/uL (ref 1.7–7.7)
Neutrophils Relative %: 81 %
Platelets: 355 10*3/uL (ref 150–400)
RBC: 3.96 MIL/uL (ref 3.87–5.11)
RDW: 12.9 % (ref 11.5–15.5)
WBC: 6.1 10*3/uL (ref 4.0–10.5)
nRBC: 0 % (ref 0.0–0.2)

## 2019-08-27 LAB — FERRITIN: Ferritin: 120 ng/mL (ref 11–307)

## 2019-08-27 LAB — COMPREHENSIVE METABOLIC PANEL
ALT: 31 U/L (ref 0–44)
AST: 23 U/L (ref 15–41)
Albumin: 3.1 g/dL — ABNORMAL LOW (ref 3.5–5.0)
Alkaline Phosphatase: 36 U/L — ABNORMAL LOW (ref 38–126)
Anion gap: 10 (ref 5–15)
BUN: 14 mg/dL (ref 6–20)
CO2: 25 mmol/L (ref 22–32)
Calcium: 8.1 mg/dL — ABNORMAL LOW (ref 8.9–10.3)
Chloride: 106 mmol/L (ref 98–111)
Creatinine, Ser: 0.65 mg/dL (ref 0.44–1.00)
GFR calc Af Amer: >60 mL/min (ref >60)
GFR calc non Af Amer: >60 mL/min (ref >60)
Glucose, Bld: 144 mg/dL — ABNORMAL HIGH (ref 70–99)
Potassium: 3.9 mmol/L (ref 3.5–5.1)
Sodium: 141 mmol/L (ref 135–145)
Total Bilirubin: 0.7 mg/dL (ref 0.3–1.2)
Total Protein: 6.6 g/dL (ref 6.5–8.1)

## 2019-08-27 LAB — D-DIMER, QUANTITATIVE: D-Dimer, Quant: 3.3 ug/mL-FEU — ABNORMAL HIGH (ref 0.00–0.50)

## 2019-08-27 LAB — PHOSPHORUS: Phosphorus: 3.7 mg/dL (ref 2.5–4.6)

## 2019-08-27 LAB — C-REACTIVE PROTEIN: CRP: 5.3 mg/dL — ABNORMAL HIGH (ref <1.0)

## 2019-08-27 LAB — MAGNESIUM: Magnesium: 2.6 mg/dL — ABNORMAL HIGH (ref 1.7–2.4)

## 2019-08-27 MED ORDER — ASCORBIC ACID 500 MG PO TABS: 500.0000 mg | ORAL_TABLET | Freq: Every day | ORAL | Status: DC

## 2019-08-27 MED ORDER — PREDNISONE 20 MG PO TABS: 60.0000 mg | ORAL_TABLET | Freq: Two times a day (BID) | ORAL | 0 refills | Status: DC

## 2019-08-27 MED ORDER — ZINC SULFATE 220 (50 ZN) MG PO CAPS: 220.0000 mg | ORAL_CAPSULE | Freq: Every day | ORAL | Status: DC

## 2019-08-27 MED ORDER — PROMETHAZINE HCL 25 MG/ML IJ SOLN: 12.5000 mg | Freq: Four times a day (QID) | INTRAMUSCULAR | Status: DC | PRN

## 2019-08-27 MED ORDER — PREDNISONE 20 MG PO TABS
60.0000 mg | ORAL_TABLET | Freq: Two times a day (BID) | ORAL | Status: DC
  Administered 2019-08-27: 60 mg via ORAL
  Filled 2019-08-27: qty 3

## 2019-08-27 NOTE — Plan of Care (Signed)

## 2019-08-27 NOTE — TOC Transition Note (Signed)
Transition of Care [facility]) - CM/SW Discharge Note   Patient Details  Name: [name] MRN: [id number] Date of Birth: June 17, 2000  [facility] of [facility]) CM/SW Contact:  Annice Needy, LCSW Phone Number: 08/27/2019, 5:14 PM   Clinical Narrative:    Oxygen delivered to room. Patient aware of what needs to be done to be compliant with charity oxygen. Mother advised to charity oxygen needs and arrangement.    Final next level of care: Home/Self Care Barriers to Discharge: No Barriers Identified   Patient Goals and CMS Choice Patient states their goals for this hospitalization and ongoing recovery are:: return home      Discharge Placement                       Discharge Plan and Services                DME Arranged: Oxygen DME Agency: [employer] Date DME Agency Contacted: 08/27/19 Time DME Agency Contacted: [phone] Representative spoke with at DME Agency: [facility]            Social Determinants of Health (SDOH) Interventions     Readmission Risk Interventions No flowsheet data found.

## 2019-08-27 NOTE — Progress Notes (Signed)
Patient was able to prone herself approximately for 6  Hours.

## 2019-08-27 NOTE — Progress Notes (Signed)
SATURATION QUALIFICATIONS: (This note is used to comply with regulatory documentation for home oxygen)  Patient Saturations on Room Air at Rest = 92  Patient Saturations on Room Air while Ambulating = 70%  Patient Saturations on 3.5 Liters of oxygen while Ambulating = 92%  Please briefly explain why patient needs home oxygen: To maintain 02 sat at 90% or above during ambulation.  Catarina Hartshorn, DO

## 2019-08-27 NOTE — Discharge Instructions (Signed)
Patient scheduled for outpatient Remdesivir infusions at 2pm at [facility]. Please inform the patient to park at [address], [place], as staff will be escorting the patient through the east entrance of the hospital.    There is a wave flag banner located near the entrance on [address] Turn into this entrance and immediately turn left and park in 1 of the 5 designated Covid Infusion Parking spots. There is a phone number on the sign, please call and let the staff know what spot you are in and we will come out and get you. For questions call [phone].  Thanks.  

## 2019-08-27 NOTE — Progress Notes (Signed)
Patient scheduled for outpatient Remdesivir infusions at 2pm at [facility]. Please inform the patient to park at [address] [place], [place], as staff will be escorting the patient through the east entrance of the hospital.    There is a wave flag banner located near the entrance on [address]. Turn into this entrance and immediately turn left and park in 1 of the 5 designated Covid Infusion Parking spots. There is a phone number on the sign, please call and let the staff know what spot you are in and we will come out and get you. For questions call [phone].  Thanks.

## 2019-08-27 NOTE — Progress Notes (Signed)
O2 delivered to room and educated on use of O2 tanks.  IV removed and script sent to pharmacy.  Transported by WC to front entrance for ride home.

## 2019-08-27 NOTE — Discharge Summary (Signed)
Physician Discharge Summary  [name] JOA:[id number] DOB: [birth year] DOA: 08/24/2019  PCP: Patient, No Pcp Per  Admit date: 08/24/2019 Discharge date: 08/27/2019  Admitted From: Home Disposition:  Home   Recommendations for Outpatient Follow-up:  1. Follow up with PCP in 1-2 weeks 2. Please obtain BMP/CBC in one week   Home Health: yes Equipment/Devices: 3.5L  Discharge Condition: Stable CODE STATUS: FULL Diet recommendation: Heart Healthy   Brief/Interim Summary: 19 year old female with a history of asthmaand obesitypresenting with approximately 10-day history of myalgias, arthralgias, and nonproductive cough and shortness of breath. The patient states that her symptoms began around 08/16/2019. She was seen in the emergency department on 08/19/2019. She was tested Covid positive at that time. She was sent home in stable condition with referral to outpatient infusion clinic to receive Regen-CoV. However, the patient declined. The patient's symptoms progressed with continued shortness of breath, coughing, myalgias. As result, the patient presented the emergency department for further evaluation. She was noted to have a low-grade temperature of 99.7 F. She was tachycardic into the 110s. Oxygen saturation was noted to be 86% on room air. The patient was started on IV steroids and remdesivir.  Discharge Diagnoses:  Acute respiratory failure with hypoxia secondary to COVID-19 pneumonia -Continue IV remdesivir and steroids -CRP 10.3>>10.4>>7.3>>5.3 -Ferritin 166>>147>>159>>120 -D-dimer 1.39>>2.01>>1.33>>3.30 -CT angiogram chest--neg PE, extensive bilateral patchy airspace disease -Continue vitamin C and zinc -Procalcitonin<0.10 -Personally reviewed chest x-ray--bilateral patchy infiltrates -remains stable on 6L>>3.5L -ambulatory pulse ox on RA desaturated <88% on day of d/c -set up 3.5 L [place] for discharge -final dose of remdesivir (5 of 5) set up in out patient  clinic -5 more days prednisone after d/c  Morbid obesity -BMI 41.16 -Lifestyle modification  Hypokalemia -Repleted -Check magnesium--2.6  Mild intermittent asthma -No wheezing presently  Leg pain -venous duplex--neg    Discharge Instructions   Allergies as of 08/27/2019      Reactions   Hydrocortisone-emollient Hives      Medication List    STOP taking these medications   EPINEPHrine 0.3 mg/0.3 mL Soaj injection Commonly known as: EpiPen 2-Pak   naproxen 500 MG tablet Commonly known as: NAPROSYN   traMADol 50 MG tablet Commonly known as: ULTRAM     TAKE these medications   ascorbic acid 500 MG tablet Commonly known as: VITAMIN C Take 1 tablet (500 mg total) by mouth daily. Start taking on: August 28, 2019   predniSONE 20 MG tablet Commonly known as: DELTASONE Take 3 tablets (60 mg total) by mouth 2 (two) times daily with a meal.   zinc sulfate 220 (50 Zn) MG capsule Take 1 capsule (220 mg total) by mouth daily. Start taking on: August 28, 2019            Durable Medical Equipment  (From admission, onward)         Start     Ordered   08/27/19 1523  For home use only DME oxygen  Once       Question Answer Comment  Length of Need 6 Months   Mode or (Route) Nasal cannula   Liters per Minute 3.5   Frequency Continuous (stationary and portable oxygen unit needed)   Oxygen conserving device Yes   Oxygen delivery system Gas      08/27/19 1522          Follow-up Information    [facility] Follow up.   Why: Charity oxygen ordered through [facility] Contact information: [address] [address] [phone]              Allergies  Allergen Reactions  . Hydrocortisone-Emollient Hives    Consultations:  none   Procedures/Studies: CT ANGIO CHEST PE W OR WO CONTRAST  Result Date: 08/25/2019 CLINICAL DATA:  Shortness of breath. Elevated D-dimer. EXAM: CT ANGIOGRAPHY CHEST WITH  CONTRAST TECHNIQUE: Multidetector CT imaging of the chest was performed using the standard protocol during bolus administration of intravenous contrast. Multiplanar CT image reconstructions and MIPs were obtained to evaluate the vascular anatomy. CONTRAST:  OMNIPAQUE IOHEXOL 350 MG/ML SOLN COMPARISON:  None. FINDINGS: Cardiovascular: Satisfactory opacification of the pulmonary arteries to the segmental level. No evidence of pulmonary embolism. Normal heart size. No pericardial effusion. Mediastinum/Nodes: No enlarged mediastinal, hilar, or axillary lymph nodes. Thyroid gland, trachea, and esophagus demonstrate no significant findings. Lungs/Pleura: No pleural effusion or pneumothorax. Bilateral patchy airspace disease in the upper and lower lobes with areas of normal aerated lung between the areas of airspace disease. Upper Abdomen: No acute abnormality. Musculoskeletal: No chest wall abnormality. No acute or significant osseous findings. Review of the MIP images confirms the above findings. IMPRESSION: 1. No evidence of pulmonary embolus. 2. Extensive bilateral patchy airspace disease in the upper and lower lobes as can be seen with multilobar pneumonia including atypical viral pneumonia such as COVID-19. Electronically Signed   By: Elige Ko   On: 08/25/2019 13:13   US Venous Img Lower Bilateral (DVT)  Result Date: 08/25/2019 CLINICAL DATA:  COVID-19 positivity with leg pain, initial encounter EXAM: BILATERAL LOWER EXTREMITY VENOUS DOPPLER ULTRASOUND TECHNIQUE: Gray-scale sonography with graded compression, as well as color Doppler and duplex ultrasound were performed to evaluate the lower extremity deep venous systems from the level of the common femoral vein and including the common femoral, femoral, profunda femoral, popliteal and calf veins including the posterior tibial, peroneal and gastrocnemius veins when visible. The superficial great saphenous vein was also interrogated. Spectral Doppler was  utilized to evaluate flow at rest and with distal augmentation maneuvers in the common femoral, femoral and popliteal veins. COMPARISON:  None. FINDINGS: RIGHT LOWER EXTREMITY Common Femoral Vein: No evidence of thrombus. Normal compressibility, respiratory phasicity and response to augmentation. Saphenofemoral Junction: No evidence of thrombus. Normal compressibility and flow on color Doppler imaging. Profunda Femoral Vein: No evidence of thrombus. Normal compressibility and flow on color Doppler imaging. Femoral Vein: No evidence of thrombus. Normal compressibility, respiratory phasicity and response to augmentation. Popliteal Vein: No evidence of thrombus. Normal compressibility, respiratory phasicity and response to augmentation. Calf Veins: No evidence of thrombus. Normal compressibility and flow on color Doppler imaging. Superficial Great Saphenous Vein: No evidence of thrombus. Normal compressibility. Venous Reflux:  None. Other Findings:  None. LEFT LOWER EXTREMITY Common Femoral Vein: No evidence of thrombus. Normal compressibility, respiratory phasicity and response to augmentation. Saphenofemoral Junction: No evidence of thrombus. Normal compressibility and flow on color Doppler imaging. Profunda Femoral Vein: No evidence of thrombus. Normal compressibility and flow on color Doppler imaging. Femoral Vein: No evidence of thrombus. Normal compressibility, respiratory phasicity and response to augmentation. Popliteal Vein: No evidence of thrombus. Normal compressibility, respiratory phasicity and response to augmentation. Calf Veins: No evidence of thrombus. Normal compressibility and flow on color Doppler imaging. Superficial Great Saphenous Vein: No evidence of thrombus. Normal compressibility. Venous Reflux:  None. Other Findings:  None. IMPRESSION: No evidence of deep venous thrombosis in either lower extremity. Electronically Signed   By: Eulah Pont.D.  On: 08/25/2019 10:57   DG Chest Port 1  View  Result Date: 08/24/2019 CLINICAL DATA:  COVID-19 positive 08/19/2019. EXAM: PORTABLE CHEST 1 VIEW COMPARISON:  08/20/2019 FINDINGS: Lungs are hypoinflated with bilateral patchy airspace opacification compatible with multifocal pneumonia. No effusion. Cardiomediastinal silhouette and remainder of the exam is unchanged. IMPRESSION: Bilateral patchy multifocal airspace process compatible with multifocal pneumonia. Electronically Signed   By: Elberta Fortis M.D.   On: 08/24/2019 18:02   DG Chest Port 1 View  Result Date: 08/19/2019 CLINICAL DATA:  Cough with aches and pains 4 days as well as fever and shortness of breath. EXAM: PORTABLE CHEST 1 VIEW COMPARISON:  04/24/2019 FINDINGS: Lungs are hypoinflated and otherwise clear. Cardiomediastinal silhouette and remainder of the exam is unchanged. IMPRESSION: Hypoinflation without acute cardiopulmonary disease. Electronically Signed   By: Elberta Fortis M.D.   On: 08/19/2019 10:53         Discharge Exam: Vitals:   08/27/19 1228 08/27/19 1425  BP: 114/74   Pulse: 72   Resp: 16   Temp: 98 F (36.7 C)   SpO2: 95% 95%   Vitals:   08/27/19 0704 08/27/19 0850 08/27/19 1228 08/27/19 1425  BP: 119/85  114/74   Pulse: 62  72   Resp: 18  16   Temp: 98.6 F (37 C)  98 F (36.7 C)   TempSrc:      SpO2: 98% 94% 95% 95%  Weight:      Height:        General: Pt is alert, awake, not in acute distress Cardiovascular: RRR, S1/S2 +, no rubs, no gallops Respiratory: bibasilar crackles. No wheeze Abdominal: Soft, NT, ND, bowel sounds + Extremities: no edema, no cyanosis   The results of significant diagnostics from this hospitalization (including imaging, microbiology, ancillary and laboratory) are listed below for reference.    Significant Diagnostic Studies: CT ANGIO CHEST PE W OR WO CONTRAST  Result Date: 08/25/2019 CLINICAL DATA:  Shortness of breath. Elevated D-dimer. EXAM: CT ANGIOGRAPHY CHEST WITH CONTRAST TECHNIQUE: Multidetector CT  imaging of the chest was performed using the standard protocol during bolus administration of intravenous contrast. Multiplanar CT image reconstructions and MIPs were obtained to evaluate the vascular anatomy. CONTRAST:  OMNIPAQUE IOHEXOL 350 MG/ML SOLN COMPARISON:  None. FINDINGS: Cardiovascular: Satisfactory opacification of the pulmonary arteries to the segmental level. No evidence of pulmonary embolism. Normal heart size. No pericardial effusion. Mediastinum/Nodes: No enlarged mediastinal, hilar, or axillary lymph nodes. Thyroid gland, trachea, and esophagus demonstrate no significant findings. Lungs/Pleura: No pleural effusion or pneumothorax. Bilateral patchy airspace disease in the upper and lower lobes with areas of normal aerated lung between the areas of airspace disease. Upper Abdomen: No acute abnormality. Musculoskeletal: No chest wall abnormality. No acute or significant osseous findings. Review of the MIP images confirms the above findings. IMPRESSION: 1. No evidence of pulmonary embolus. 2. Extensive bilateral patchy airspace disease in the upper and lower lobes as can be seen with multilobar pneumonia including atypical viral pneumonia such as COVID-19. Electronically Signed   By: Elige Ko   On: 08/25/2019 13:13   US Venous Img Lower Bilateral (DVT)  Result Date: 08/25/2019 CLINICAL DATA:  COVID-19 positivity with leg pain, initial encounter EXAM: BILATERAL LOWER EXTREMITY VENOUS DOPPLER ULTRASOUND TECHNIQUE: Gray-scale sonography with graded compression, as well as color Doppler and duplex ultrasound were performed to evaluate the lower extremity deep venous systems from the level of the common femoral vein and including the common femoral, femoral, profunda femoral,  popliteal and calf veins including the posterior tibial, peroneal and gastrocnemius veins when visible. The superficial great saphenous vein was also interrogated. Spectral Doppler was utilized to evaluate flow at rest  and with distal augmentation maneuvers in the common femoral, femoral and popliteal veins. COMPARISON:  None. FINDINGS: RIGHT LOWER EXTREMITY Common Femoral Vein: No evidence of thrombus. Normal compressibility, respiratory phasicity and response to augmentation. Saphenofemoral Junction: No evidence of thrombus. Normal compressibility and flow on color Doppler imaging. Profunda Femoral Vein: No evidence of thrombus. Normal compressibility and flow on color Doppler imaging. Femoral Vein: No evidence of thrombus. Normal compressibility, respiratory phasicity and response to augmentation. Popliteal Vein: No evidence of thrombus. Normal compressibility, respiratory phasicity and response to augmentation. Calf Veins: No evidence of thrombus. Normal compressibility and flow on color Doppler imaging. Superficial Great Saphenous Vein: No evidence of thrombus. Normal compressibility. Venous Reflux:  None. Other Findings:  None. LEFT LOWER EXTREMITY Common Femoral Vein: No evidence of thrombus. Normal compressibility, respiratory phasicity and response to augmentation. Saphenofemoral Junction: No evidence of thrombus. Normal compressibility and flow on color Doppler imaging. Profunda Femoral Vein: No evidence of thrombus. Normal compressibility and flow on color Doppler imaging. Femoral Vein: No evidence of thrombus. Normal compressibility, respiratory phasicity and response to augmentation. Popliteal Vein: No evidence of thrombus. Normal compressibility, respiratory phasicity and response to augmentation. Calf Veins: No evidence of thrombus. Normal compressibility and flow on color Doppler imaging. Superficial Great Saphenous Vein: No evidence of thrombus. Normal compressibility. Venous Reflux:  None. Other Findings:  None. IMPRESSION: No evidence of deep venous thrombosis in either lower extremity. Electronically Signed   By: Alcide Clever M.D.   On: 08/25/2019 10:57   DG Chest Port 1 View  Result Date:  08/24/2019 CLINICAL DATA:  COVID-19 positive 08/19/2019. EXAM: PORTABLE CHEST 1 VIEW COMPARISON:  08/20/2019 FINDINGS: Lungs are hypoinflated with bilateral patchy airspace opacification compatible with multifocal pneumonia. No effusion. Cardiomediastinal silhouette and remainder of the exam is unchanged. IMPRESSION: Bilateral patchy multifocal airspace process compatible with multifocal pneumonia. Electronically Signed   By: Elberta Fortis M.D.   On: 08/24/2019 18:02   DG Chest Port 1 View  Result Date: 08/19/2019 CLINICAL DATA:  Cough with aches and pains 4 days as well as fever and shortness of breath. EXAM: PORTABLE CHEST 1 VIEW COMPARISON:  04/24/2019 FINDINGS: Lungs are hypoinflated and otherwise clear. Cardiomediastinal silhouette and remainder of the exam is unchanged. IMPRESSION: Hypoinflation without acute cardiopulmonary disease. Electronically Signed   By: Elberta Fortis M.D.   On: 08/19/2019 10:53     Microbiology: Recent Results (from the past 240 hour(s))  SARS Coronavirus 2 by RT PCR (hospital order, performed in [facility] hospital lab) Nasopharyngeal Nasopharyngeal Swab     Status: Abnormal   Collection Time: 08/19/19  9:51 AM   Specimen: Nasopharyngeal Swab  Result Value Ref Range Status   SARS Coronavirus 2 POSITIVE (A) NEGATIVE Final    Comment: CRITICAL RESULT CALLED TO, READ BACK BY AND VERIFIED WITH: SUSAN OWENS,RN  08/09/2021KAY (NOTE) SARS-CoV-2 target nucleic acids are DETECTED  SARS-CoV-2 RNA is generally detectable in upper respiratory specimens  during the acute phase of infection.  Positive results are indicative  of the presence of the identified virus, but do not rule out bacterial infection or co-infection with other pathogens not detected by the test.  Clinical correlation with patient history and  other diagnostic information is necessary to determine patient infection status.  The expected result is negative.  Fact Sheet for Patients:  BoilerBrush.com.cy   Fact Sheet for Healthcare Providers:   https://pope.com/    This test is not yet approved or cleared by the [place] FDA and  has been authorized for detection and/or diagnosis of SARS-CoV-2 by FDA under an Emergency Use Authorization (EUA).  This EUA will remain in effect (meanin g this test can be used) for the duration of  the COVID-19 declaration under Section 564(b)(1) of the Act, 21 U.S.C. section 360-bbb-3(b)(1), unless the authorization is terminated or revoked sooner.  Performed at [facility], [address] [place], [place] [address]   Blood Culture (routine x 2)     Status: None (Preliminary result)   Collection Time: 08/24/19  5:59 PM   Specimen: Left Antecubital; Blood  Result Value Ref Range Status   Specimen Description LEFT ANTECUBITAL  Final   Special Requests   Final    BOTTLES DRAWN AEROBIC AND ANAEROBIC Blood Culture adequate volume   Culture   Final    NO GROWTH 2 DAYS Performed at [facility], [address] [place], [place] [address]    Report Status PENDING  Incomplete  Blood Culture (routine x 2)     Status: None (Preliminary result)   Collection Time: 08/24/19  5:59 PM   Specimen: BLOOD RIGHT HAND  Result Value Ref Range Status   Specimen Description BLOOD RIGHT HAND  Final   Special Requests   Final    BOTTLES DRAWN AEROBIC AND ANAEROBIC Blood Culture adequate volume   Culture   Final    NO GROWTH 2 DAYS Performed at [facility], [address] , [place] [address]    Report Status PENDING  Incomplete     Labs: Basic Metabolic Panel: Recent Labs  Lab 08/24/19 1459 08/24/19 1459 08/25/19 0626 08/25/19 0626 08/26/19 0355 08/27/19 0444  NA 135  --  138  --  139 141  K 3.1*   < > 3.9   < > 4.2 3.9  CL 99  --  104  --  105 106  CO2 23  --  22  --  24 25  GLUCOSE 125*  --  119*  --  125* 144*  BUN 13  --  10  --  12 14  CREATININE 0.89  --  0.73  --  0.64  0.65  CALCIUM 7.9*  --  7.7*  --  8.1* 8.1*  MG  --   --  2.3  --  2.5* 2.6*  PHOS  --   --  3.0  --  2.8 3.7   < > = values in this interval not displayed.   Liver Function Tests: Recent Labs  Lab 08/24/19 1459 08/25/19 0626 08/26/19 0355 08/27/19 0444  AST 61* 52* 40 23  ALT 36 34 38 31  ALKPHOS 38 34* 32* 36*  BILITOT 1.0 0.7 0.6 0.7  PROT 7.2 6.6 6.6 6.6  ALBUMIN 3.4* 3.1* 3.1* 3.1*   No results for input(s): LIPASE, AMYLASE in the last 168 hours. No results for input(s): AMMONIA in the last 168 hours. CBC: Recent Labs  Lab 08/24/19 1459 08/25/19 0626 08/26/19 0355 08/27/19 0444  WBC 5.1 4.9 5.0 6.1  NEUTROABS  --  4.1 3.9 5.0  HGB 12.3 10.9* 10.7* 11.1*  HCT 38.0 35.3* 33.3* 35.0*  MCV 86.8 89.6 87.6 88.4  PLT 252 255 260 355   Cardiac Enzymes: No results for input(s): CKTOTAL, CKMB, CKMBINDEX, TROPONINI in the last 168 hours. BNP: Invalid input(s): POCBNP CBG: No results for  input(s): GLUCAP in the last 168 hours.  Time coordinating discharge:  36 minutes  Signed:  Catarina Hartshornavid Darriana Deboy, DO [facility] Pager: [phone] 08/27/2019, 4:41 PM

## 2019-08-28 ENCOUNTER — Ambulatory Visit (HOSPITAL_COMMUNITY): Payer: Medicaid Other

## 2019-08-29 LAB — CULTURE, BLOOD (ROUTINE X 2)
Culture: NO GROWTH
Culture: NO GROWTH
Special Requests: ADEQUATE
Special Requests: ADEQUATE

## 2019-09-01 ENCOUNTER — Emergency Department (HOSPITAL_COMMUNITY): Payer: Self-pay

## 2019-09-01 ENCOUNTER — Encounter (HOSPITAL_COMMUNITY): Payer: Self-pay | Admitting: Emergency Medicine

## 2019-09-01 ENCOUNTER — Inpatient Hospital Stay (HOSPITAL_COMMUNITY)
Admission: EM | Admit: 2019-09-01 | Discharge: 2019-09-07 | DRG: 175 | Disposition: A | Discharge status: Home / Self Care | Payer: Self-pay | Attending: Internal Medicine | Admitting: Internal Medicine

## 2019-09-01 ENCOUNTER — Other Ambulatory Visit: Payer: Self-pay

## 2019-09-01 DIAGNOSIS — Z79899 Other long term (current) drug therapy: Secondary | ICD-10-CM

## 2019-09-01 DIAGNOSIS — J069 Acute upper respiratory infection, unspecified: Secondary | ICD-10-CM | POA: Diagnosis present

## 2019-09-01 DIAGNOSIS — Z7952 Long term (current) use of systemic steroids: Secondary | ICD-10-CM

## 2019-09-01 DIAGNOSIS — I82442 Acute embolism and thrombosis of left tibial vein: Secondary | ICD-10-CM | POA: Diagnosis present

## 2019-09-01 DIAGNOSIS — K5900 Constipation, unspecified: Secondary | ICD-10-CM | POA: Diagnosis present

## 2019-09-01 DIAGNOSIS — T380X5A Adverse effect of glucocorticoids and synthetic analogues, initial encounter: Secondary | ICD-10-CM | POA: Diagnosis present

## 2019-09-01 DIAGNOSIS — U071 COVID-19: Secondary | ICD-10-CM | POA: Diagnosis present

## 2019-09-01 DIAGNOSIS — R042 Hemoptysis: Secondary | ICD-10-CM | POA: Diagnosis present

## 2019-09-01 DIAGNOSIS — D649 Anemia, unspecified: Secondary | ICD-10-CM | POA: Diagnosis present

## 2019-09-01 DIAGNOSIS — J9601 Acute respiratory failure with hypoxia: Secondary | ICD-10-CM | POA: Diagnosis present

## 2019-09-01 DIAGNOSIS — D72829 Elevated white blood cell count, unspecified: Secondary | ICD-10-CM | POA: Diagnosis present

## 2019-09-01 DIAGNOSIS — N920 Excessive and frequent menstruation with regular cycle: Secondary | ICD-10-CM | POA: Diagnosis present

## 2019-09-01 DIAGNOSIS — J4520 Mild intermittent asthma, uncomplicated: Secondary | ICD-10-CM | POA: Diagnosis present

## 2019-09-01 DIAGNOSIS — R0602 Shortness of breath: Secondary | ICD-10-CM

## 2019-09-01 DIAGNOSIS — Z6841 Body Mass Index (BMI) 40.0 and over, adult: Secondary | ICD-10-CM

## 2019-09-01 DIAGNOSIS — Z7722 Contact with and (suspected) exposure to environmental tobacco smoke (acute) (chronic): Secondary | ICD-10-CM | POA: Diagnosis present

## 2019-09-01 DIAGNOSIS — Z888 Allergy status to other drugs, medicaments and biological substances status: Secondary | ICD-10-CM

## 2019-09-01 DIAGNOSIS — J189 Pneumonia, unspecified organism: Secondary | ICD-10-CM | POA: Diagnosis present

## 2019-09-01 DIAGNOSIS — I2699 Other pulmonary embolism without acute cor pulmonale: Principal | ICD-10-CM | POA: Diagnosis present

## 2019-09-01 LAB — CBC WITH DIFFERENTIAL/PLATELET
Abs Immature Granulocytes: 0.28 10*3/uL — ABNORMAL HIGH (ref 0.00–0.07)
Basophils Absolute: 0 10*3/uL (ref 0.0–0.1)
Basophils Relative: 0 %
Eosinophils Absolute: 0 10*3/uL (ref 0.0–0.5)
Eosinophils Relative: 0 %
HCT: 31.8 % — ABNORMAL LOW (ref 36.0–46.0)
Hemoglobin: 10.4 g/dL — ABNORMAL LOW (ref 12.0–15.0)
Immature Granulocytes: 2 %
Lymphocytes Relative: 5 %
Lymphs Abs: 0.9 10*3/uL (ref 0.7–4.0)
MCH: 28.2 pg (ref 26.0–34.0)
MCHC: 32.7 g/dL (ref 30.0–36.0)
MCV: 86.2 fL (ref 80.0–100.0)
Monocytes Absolute: 0.6 10*3/uL (ref 0.1–1.0)
Monocytes Relative: 3 %
Neutro Abs: 16.9 10*3/uL — ABNORMAL HIGH (ref 1.7–7.7)
Neutrophils Relative %: 90 %
Platelets: 300 10*3/uL (ref 150–400)
RBC: 3.69 MIL/uL — ABNORMAL LOW (ref 3.87–5.11)
RDW: 12.9 % (ref 11.5–15.5)
WBC: 18.7 10*3/uL — ABNORMAL HIGH (ref 4.0–10.5)
nRBC: 0 % (ref 0.0–0.2)

## 2019-09-01 LAB — BASIC METABOLIC PANEL
Anion gap: 12 (ref 5–15)
BUN: 9 mg/dL (ref 6–20)
CO2: 24 mmol/L (ref 22–32)
Calcium: 8.4 mg/dL — ABNORMAL LOW (ref 8.9–10.3)
Chloride: 101 mmol/L (ref 98–111)
Creatinine, Ser: 0.75 mg/dL (ref 0.44–1.00)
GFR calc Af Amer: >60 mL/min (ref >60)
GFR calc non Af Amer: >60 mL/min (ref >60)
Glucose, Bld: 132 mg/dL — ABNORMAL HIGH (ref 70–99)
Potassium: 4.1 mmol/L (ref 3.5–5.1)
Sodium: 137 mmol/L (ref 135–145)

## 2019-09-01 LAB — I-STAT BETA HCG BLOOD, ED (MC, WL, AP ONLY): I-stat hCG, quantitative: <5 m[IU]/mL (ref <5)

## 2019-09-01 MED ORDER — ACETAMINOPHEN 650 MG RE SUPP: 650.0000 mg | Freq: Four times a day (QID) | RECTAL | Status: DC | PRN

## 2019-09-01 MED ORDER — ALBUTEROL SULFATE HFA 108 (90 BASE) MCG/ACT IN AERS
2.0000 | INHALATION_SPRAY | Freq: Four times a day (QID) | RESPIRATORY_TRACT | Status: DC | PRN
  Administered 2019-09-04: 2 via RESPIRATORY_TRACT
  Filled 2019-09-01: qty 6.7

## 2019-09-01 MED ORDER — ASCORBIC ACID 500 MG PO TABS
500.0000 mg | ORAL_TABLET | Freq: Every day | ORAL | Status: DC
  Administered 2019-09-02 – 2019-09-07 (×6): 500 mg via ORAL
  Filled 2019-09-01 (×6): qty 1

## 2019-09-01 MED ORDER — ZINC SULFATE 220 (50 ZN) MG PO CAPS
220.0000 mg | ORAL_CAPSULE | Freq: Every day | ORAL | Status: DC
  Administered 2019-09-02 – 2019-09-07 (×6): 220 mg via ORAL
  Filled 2019-09-01 (×6): qty 1

## 2019-09-01 MED ORDER — ONDANSETRON HCL 4 MG/2ML IJ SOLN
4.0000 mg | Freq: Four times a day (QID) | INTRAMUSCULAR | Status: DC | PRN
  Administered 2019-09-03 – 2019-09-04 (×2): 4 mg via INTRAVENOUS
  Filled 2019-09-01 (×2): qty 2

## 2019-09-01 MED ORDER — MORPHINE SULFATE (PF) 4 MG/ML IV SOLN
4.0000 mg | Freq: Once | INTRAVENOUS | Status: AC
  Administered 2019-09-01: 4 mg via INTRAVENOUS
  Filled 2019-09-01: qty 1

## 2019-09-01 MED ORDER — IOHEXOL 350 MG/ML SOLN
100.0000 mL | Freq: Once | INTRAVENOUS | Status: AC | PRN
  Administered 2019-09-01: 100 mL via INTRAVENOUS

## 2019-09-01 MED ORDER — ONDANSETRON HCL 4 MG PO TABS: 4.0000 mg | ORAL_TABLET | Freq: Four times a day (QID) | ORAL | Status: DC | PRN

## 2019-09-01 MED ORDER — SODIUM CHLORIDE (PF) 0.9 % IJ SOLN
INTRAMUSCULAR | Status: AC
  Filled 2019-09-01: qty 50

## 2019-09-01 MED ORDER — ACETAMINOPHEN 325 MG PO TABS
650.0000 mg | ORAL_TABLET | Freq: Four times a day (QID) | ORAL | Status: DC | PRN
  Administered 2019-09-03 – 2019-09-06 (×3): 650 mg via ORAL
  Filled 2019-09-01 (×3): qty 2

## 2019-09-01 MED ORDER — HYDROCODONE-ACETAMINOPHEN 5-325 MG PO TABS
1.0000 | ORAL_TABLET | Freq: Four times a day (QID) | ORAL | Status: DC | PRN
  Administered 2019-09-02: 2 via ORAL
  Administered 2019-09-02: 1 via ORAL
  Filled 2019-09-01: qty 2
  Filled 2019-09-01: qty 1

## 2019-09-01 MED ORDER — HEPARIN (PORCINE) 25000 UT/250ML-% IV SOLN
2000.0000 [IU]/h | INTRAVENOUS | Status: DC
  Administered 2019-09-01: 1700 [IU]/h via INTRAVENOUS
  Administered 2019-09-03 (×2): 2000 [IU]/h via INTRAVENOUS
  Filled 2019-09-01 (×4): qty 250

## 2019-09-01 MED ORDER — MORPHINE SULFATE (PF) 2 MG/ML IV SOLN: 2.0000 mg | INTRAVENOUS | Status: DC | PRN

## 2019-09-01 MED ORDER — HEPARIN BOLUS VIA INFUSION
2500.0000 [IU] | Freq: Once | INTRAVENOUS | Status: AC
  Administered 2019-09-01: 2500 [IU] via INTRAVENOUS
  Filled 2019-09-01: qty 2500

## 2019-09-01 NOTE — ED Notes (Signed)
Blue and gold (x2) save tubes in lab.

## 2019-09-01 NOTE — Progress Notes (Signed)
ANTICOAGULATION CONSULT NOTE - Initial Consult  Pharmacy Consult for Heparin Indication: pulmonary embolus  Allergies  Allergen Reactions  . Hydrocortisone-Emollient Hives    Patient Measurements:   Heparin Dosing Weight: 86kg  Vital Signs: Temp: 98.4 F (36.9 C) (08/22 2237) Temp Source: Oral (08/22 2237) BP: 114/103 (08/22 2240) Pulse Rate: 92 (08/22 2240)  Labs: Recent Labs    09/01/19 2140  HGB 10.4*  HCT 31.8*  PLT 300  CREATININE 0.75    Estimated Creatinine Clearance: 145 mL/min (by C-G formula based on SCr of 0.75 mg/dL).   Medical History: Past Medical History:  Diagnosis Date  . Asthma   . Eczema   . Obesity     Medications:  Infusions:   Assessment: 19 yo F recently diagnosed with COVID PNA presents with increasing shortness of breath and hemoptysis.  Chest CT + PE. Not on blood thinners PTA Baseline CBC- Hg 10.4, pltc 300  Goal of Therapy:  Heparin level 0.3-0.7 units/ml Monitor platelets by anticoagulation protocol: Yes   Plan:  Heparin bolus 2500 units IV x1 followed by infusion at 1700 units/hr Check 6h heparin level Daily heparin level & CBC while on heparin F/U long-term anticoagulation plans  Junita Push PharmD, BCPS 09/01/2019,11:22 PM

## 2019-09-01 NOTE — ED Triage Notes (Signed)
Pt was covid positive on 8/9. Reports is on 3-4L oxygen continuously. Reports having sob, cough, cant lay down, having left side pains. Was seen in Ed in [place] this morning but wasn't being treated for her symptoms and was treated rudely by staff.

## 2019-09-01 NOTE — ED Provider Notes (Addendum)
[facility]-EMERGENCY DEPT Provider Note   CSN: [id number] Arrival date & time: 09/01/19  1747     History Chief Complaint  Patient presents with  . Covid positive    8/9  . Shortness of Breath  . Cough    [name] is a 19 y.o. female.  HPI   Patient started having difficulties with body aches fever and a cough.  She was seen in the emergency room on August 9 and was diagnosed with COVID-19.  Patient unfortunately had progressive symptoms.  August 14 she was admitted to the hospital for Covid pneumonia with hypoxemia.  Patient was treated with remdesivir and steroids.  She was discharged on August 17.  Patient was sent home on home oxygen.  She continues to have difficulty with her breathing and in the last couple of days she started having increasing pain on the left side of her chest.  She has had productive sputum  both purulent and and bloody.  Patient feels worse when she is lying flat and better when she is sitting up.  Because of her worsening symptoms she came to the ED for further evaluation  Past Medical History:  Diagnosis Date  . Asthma   . Eczema   . Obesity     Patient Active Problem List   Diagnosis Date Noted  . Acute respiratory failure with hypoxia (HCC) 08/24/2019  . Acute respiratory disease due to COVID-19 virus 08/24/2019  . Obesity, Class III, BMI 40-49.9 (morbid obesity) (HCC) 04/25/2016  . Allergic conjunctivitis of both eyes 04/25/2016  . Onychomycosis 04/25/2016  . Chronic pain of right knee 04/25/2016  . Eczema 04/25/2016  . Mild intermittent asthma, uncomplicated 04/25/2016    Past Surgical History:  Procedure Laterality Date  . FOOT SURGERY     bilateral arch repair     OB History    Gravida  0   Para  0   Term  0   Preterm  0   AB  0   Living  0     SAB  0   TAB  0   Ectopic  0   Multiple  0   Live Births  0           Family History  Problem Relation Age of Onset  . Healthy Mother      Social History   Tobacco Use  . Smoking status: Passive Smoke Exposure - Never Smoker  . Smokeless tobacco: Never Used  Vaping Use  . Vaping Use: Every day  . Substances: Nicotine, Mixture of cannabinoids  Substance Use Topics  . Alcohol use: Yes    Comment: occ  . Drug use: Yes    Home Medications Prior to Admission medications   Medication Sig Start Date End Date Taking? Authorizing Provider  albuterol (VENTOLIN HFA) 108 (90 Base) MCG/ACT inhaler Inhale 2 puffs into the lungs every 6 (six) hours as needed for wheezing or shortness of breath.   Yes [provider]  predniSONE (DELTASONE) 20 MG tablet Take 3 tablets (60 mg total) by mouth 2 (two) times daily with a meal. 08/27/19  Yes Tat, David, MD  ascorbic acid (VITAMIN C) 500 MG tablet Take 1 tablet (500 mg total) by mouth daily. 08/28/19   Catarina Hartshorn, MD  zinc sulfate 220 (50 Zn) MG capsule Take 1 capsule (220 mg total) by mouth daily. 08/28/19   Catarina Hartshorn, MD    Allergies    Hydrocortisone-emollient  Review of Systems  Review of Systems  All other systems reviewed and are negative.   Physical Exam Updated Vital Signs BP (!) 114/103   Pulse 92   Temp 98.4 F (36.9 C) (Oral)   Resp (!) 33   LMP 09/01/2019 Comment: neg preg test  SpO2 97%   Physical Exam Vitals and nursing note reviewed.  Constitutional:      Appearance: She is well-developed. She is not toxic-appearing.  HENT:     Head: Normocephalic and atraumatic.     Right Ear: External ear normal.     Left Ear: External ear normal.  Eyes:     General: No scleral icterus.       Right eye: No discharge.        Left eye: No discharge.     Conjunctiva/sclera: Conjunctivae normal.  Neck:     Trachea: No tracheal deviation.  Cardiovascular:     Rate and Rhythm: Normal rate and regular rhythm.  Pulmonary:     Effort: Pulmonary effort is normal. No respiratory distress.     Breath sounds: No stridor. Rales present.  Abdominal:     General:  Bowel sounds are normal. There is no distension.     Palpations: Abdomen is soft.     Tenderness: There is no abdominal tenderness. There is no guarding or rebound.  Musculoskeletal:        General: No tenderness.     Cervical back: Neck supple.  Skin:    General: Skin is warm and dry.     Findings: No rash.  Neurological:     Mental Status: She is alert.     Cranial Nerves: No cranial nerve deficit (no facial droop, extraocular movements intact, no slurred speech).     Sensory: No sensory deficit.     Motor: No abnormal muscle tone or seizure activity.     Coordination: Coordination normal.     ED Results / Procedures / Treatments   Labs (all labs ordered are listed, but only abnormal results are displayed) Labs Reviewed  CBC WITH DIFFERENTIAL/PLATELET - Abnormal; Notable for the following components:      Result Value   WBC 18.7 (*)    RBC 3.69 (*)    Hemoglobin 10.4 (*)    HCT 31.8 (*)    Neutro Abs 16.9 (*)    Abs Immature Granulocytes 0.28 (*)    All other components within normal limits  BASIC METABOLIC PANEL - Abnormal; Notable for the following components:   Glucose, Bld 132 (*)    Calcium 8.4 (*)    All other components within normal limits  I-STAT BETA HCG BLOOD, ED ([facility], [facility], [facility] ONLY)    EKG EKG Interpretation  Date/Time:  Sunday September 01 2019 19:56:47 EDT Ventricular Rate:  111 PR Interval:    QRS Duration: 76 QT Interval:  319 QTC Calculation: 434 R Axis:   35 Text Interpretation: Sinus tachycardia Ventricular premature complex Nonspecific T abnormalities, diffuse leads No old tracing to compare Confirmed by Linwood Dibbles [phone]) on 09/01/2019 8:26:35 PM   Radiology CT Angio Chest PE W and/or Wo Contrast  Result Date: 09/01/2019 CLINICAL DATA:  COVID positive with fever and vomiting. EXAM: CT ANGIOGRAPHY CHEST WITH CONTRAST TECHNIQUE: Multidetector CT imaging of the chest was performed using the standard protocol during bolus administration of intravenous  contrast. Multiplanar CT image reconstructions and MIPs were obtained to evaluate the vascular anatomy. CONTRAST:  OMNIPAQUE IOHEXOL 350 MG/ML SOLN COMPARISON:  August 25, 2019 FINDINGS: Cardiovascular: A mild  amount of intraluminal low attenuation is seen involving multiple lower lobe branches of the bilateral pulmonary arteries. Normal heart size. No pericardial effusion. Mediastinum/Nodes: No enlarged mediastinal, hilar, or axillary lymph nodes. Thyroid gland, trachea, and esophagus demonstrate no significant findings. Lungs/Pleura: Marked severity patchy multifocal infiltrates are seen scattered throughout both lungs. There is no evidence of a pleural effusion or pneumothorax. Upper Abdomen: No acute abnormality. Musculoskeletal: No chest wall abnormality. No acute or significant osseous findings. Review of the MIP images confirms the above findings. IMPRESSION: 1. Mild pulmonary embolism involving multiple lower lobe branches of the bilateral pulmonary arteries. 2. Marked severity patchy bilateral multifocal infiltrates. Electronically Signed   By: Aram Candela M.D.   On: 09/01/2019 22:57   DG Chest Port 1 View  Result Date: 09/01/2019 CLINICAL DATA:  Shortness of breath, cough and COVID positive. EXAM: PORTABLE CHEST 1 VIEW COMPARISON:  09/01/2019 and prior radiographs FINDINGS: Diffuse bilateral airspace opacities are not significantly changed. Cardiomegaly again noted. No pleural effusion or pneumothorax. No acute bony abnormalities are present. IMPRESSION: Unchanged diffuse bilateral airspace opacities compatible with infection/pneumonia. Electronically Signed   By: Harmon Pier M.D.   On: 09/01/2019 18:46    Procedures .Critical Care Performed by: Linwood Dibbles, MD Authorized by: Linwood Dibbles, MD   Critical care provider statement:    Critical care time (minutes):  45   Critical care was time spent personally by me on the following activities:  Discussions with consultants, evaluation of  patient's response to treatment, examination of patient, ordering and performing treatments and interventions, ordering and review of laboratory studies, ordering and review of radiographic studies, pulse oximetry, re-evaluation of patient's condition, obtaining history from patient or surrogate and review of old charts   (including critical care time)  Medications Ordered in ED Medications  sodium chloride (PF) 0.9 % injection (has no administration in time range)  morphine 4 MG/ML injection 4 mg (4 mg Intravenous Given 09/01/19 2130)  iohexol (OMNIPAQUE) 350 MG/ML injection 100 mL (100 mLs Intravenous Contrast Given 09/01/19 2215)    ED Course  I have reviewed the triage vital signs and the nursing notes.  Pertinent labs & imaging results that were available during my care of the patient were reviewed by me and considered in my medical decision making (see chart for details).  Clinical Course as of Aug 31 2304  Wynelle Link Sep 01, 2019  1324 Care everywhere reviewed including visits to other medical facilities   [JK]  1852 WIth her hemoptysis and increasing pain, will order CT angio rule out pe   [JK]  2110 Vital signs reviewed.  Respiratory rate erroneously documented at 84 2017   [JK]    Clinical Course User Index [JK] Linwood Dibbles, MD   MDM Rules/Calculators/A&P                          Patient presented to ED for increasing shortness of breath after recent hospitalization for COVID-19. Patient started having increasing chest pain. Patient also started to have hemoptysis. He was concerned about the possibility of pulmonary embolism considering her symptoms. Patient CT scan does confirm pulmonary embolism on top of her COVID-19 injury to her lung I will start her on IV heparin. I will consult the medical service for admission. Final Clinical Impression(s) / ED Diagnoses Final diagnoses:  Acute pulmonary embolism, unspecified pulmonary embolism type, unspecified whether acute cor pulmonale  present (HCC)  COVID-19      Linwood Dibbles, MD  09/01/19 2308 Cc addendum   Linwood DibblesKnapp, Adea Geisel, MD 09/17/19 [id number]

## 2019-09-02 ENCOUNTER — Observation Stay (HOSPITAL_COMMUNITY): Payer: Self-pay

## 2019-09-02 DIAGNOSIS — I2699 Other pulmonary embolism without acute cor pulmonale: Secondary | ICD-10-CM

## 2019-09-02 DIAGNOSIS — J069 Acute upper respiratory infection, unspecified: Secondary | ICD-10-CM

## 2019-09-02 DIAGNOSIS — U071 COVID-19: Secondary | ICD-10-CM | POA: Diagnosis not present

## 2019-09-02 DIAGNOSIS — I2602 Saddle embolus of pulmonary artery with acute cor pulmonale: Secondary | ICD-10-CM

## 2019-09-02 LAB — HEPARIN LEVEL (UNFRACTIONATED)
Heparin Unfractionated: 0.39 IU/mL (ref 0.30–0.70)
Heparin Unfractionated: 0.12 IU/mL — ABNORMAL LOW (ref 0.30–0.70)
Heparin Unfractionated: 0.6 IU/mL (ref 0.30–0.70)

## 2019-09-02 LAB — BASIC METABOLIC PANEL
Anion gap: 12 (ref 5–15)
BUN: 9 mg/dL (ref 6–20)
CO2: 26 mmol/L (ref 22–32)
Calcium: 8.7 mg/dL — ABNORMAL LOW (ref 8.9–10.3)
Chloride: 101 mmol/L (ref 98–111)
Creatinine, Ser: 0.86 mg/dL (ref 0.44–1.00)
GFR calc Af Amer: >60 mL/min (ref >60)
GFR calc non Af Amer: >60 mL/min (ref >60)
Glucose, Bld: 147 mg/dL — ABNORMAL HIGH (ref 70–99)
Potassium: 3.7 mmol/L (ref 3.5–5.1)
Sodium: 139 mmol/L (ref 135–145)

## 2019-09-02 LAB — CBC
HCT: 30.3 % — ABNORMAL LOW (ref 36.0–46.0)
Hemoglobin: 9.9 g/dL — ABNORMAL LOW (ref 12.0–15.0)
MCH: 28.5 pg (ref 26.0–34.0)
MCHC: 32.7 g/dL (ref 30.0–36.0)
MCV: 87.3 fL (ref 80.0–100.0)
Platelets: 308 10*3/uL (ref 150–400)
RBC: 3.47 MIL/uL — ABNORMAL LOW (ref 3.87–5.11)
RDW: 13.2 % (ref 11.5–15.5)
WBC: 18.3 10*3/uL — ABNORMAL HIGH (ref 4.0–10.5)
nRBC: 0 % (ref 0.0–0.2)

## 2019-09-02 LAB — ECHOCARDIOGRAM COMPLETE
Area-P 1/2: 2.48 cm2
S' Lateral: 3 cm

## 2019-09-02 LAB — TROPONIN I (HIGH SENSITIVITY): Troponin I (High Sensitivity): 3 ng/L (ref <18)

## 2019-09-02 MED ORDER — DIPHENHYDRAMINE HCL 50 MG/ML IJ SOLN: 50.0000 mg | Freq: Once | INTRAMUSCULAR | Status: DC | PRN

## 2019-09-02 MED ORDER — BENZONATATE 100 MG PO CAPS
200.0000 mg | ORAL_CAPSULE | Freq: Three times a day (TID) | ORAL | Status: DC | PRN
  Administered 2019-09-02 – 2019-09-03 (×2): 200 mg via ORAL
  Filled 2019-09-02 (×2): qty 2

## 2019-09-02 MED ORDER — ALBUTEROL SULFATE HFA 108 (90 BASE) MCG/ACT IN AERS
2.0000 | INHALATION_SPRAY | Freq: Once | RESPIRATORY_TRACT | Status: AC | PRN
  Administered 2019-09-03: 2 via RESPIRATORY_TRACT

## 2019-09-02 MED ORDER — FAMOTIDINE IN NACL 20-0.9 MG/50ML-% IV SOLN: 20.0000 mg | Freq: Once | INTRAVENOUS | Status: DC | PRN

## 2019-09-02 MED ORDER — GUAIFENESIN-DM 100-10 MG/5ML PO SYRP
5.0000 mL | ORAL_SOLUTION | ORAL | Status: DC | PRN
  Administered 2019-09-02: 5 mL via ORAL
  Filled 2019-09-02: qty 10

## 2019-09-02 MED ORDER — SODIUM CHLORIDE 0.9 % IV SOLN
100.0000 mg | Freq: Once | INTRAVENOUS | Status: AC
  Administered 2019-09-02: 100 mg via INTRAVENOUS
  Filled 2019-09-02: qty 20

## 2019-09-02 MED ORDER — METHYLPREDNISOLONE SODIUM SUCC 125 MG IJ SOLR: 125.0000 mg | Freq: Once | INTRAMUSCULAR | Status: DC | PRN

## 2019-09-02 MED ORDER — EPINEPHRINE 0.3 MG/0.3ML IJ SOAJ: 0.3000 mg | Freq: Once | INTRAMUSCULAR | Status: DC | PRN

## 2019-09-02 MED ORDER — SODIUM CHLORIDE 0.9 % IV SOLN: INTRAVENOUS | Status: DC | PRN

## 2019-09-02 NOTE — H&P (Signed)
History and Physical    [name] HYW:[id number] DOB: 2000-03-09 DOA: 09/01/2019  PCP: Patient, No Pcp Per  Patient coming from: Home  I have personally briefly reviewed patient's old medical records in [facility]  Chief Complaint: SOB, CP  HPI: [name] is a 19 y.o. female with medical history significant of recent admit for COVID PNA.  Just discharged on 8/17 on 3.5L O2, just finished prednisone today.  Pt having ongoing difficulty breathing, but in the last couple of days she has started having increasing pain on L side of chest.  Sputum productive of bloody mucous.  Returns to ED for evaluation.   ED Course: WBC 18k.  CT scan shows multifocal infiltrates, also shows new mild PE.  Heparin gtt started.   Review of Systems: As per HPI, otherwise all review of systems negative.  Past Medical History:  Diagnosis Date  . Asthma   . Eczema   . Obesity     Past Surgical History:  Procedure Laterality Date  . FOOT SURGERY     bilateral arch repair     reports that she is a non-smoker but has been exposed to tobacco smoke. She has never used smokeless tobacco. She reports current alcohol use. She reports current drug use.  Allergies  Allergen Reactions  . Hydrocortisone-Emollient Hives    Family History  Problem Relation Age of Onset  . Healthy Mother      Prior to Admission medications   Medication Sig Start Date End Date Taking? Authorizing Provider  albuterol (VENTOLIN HFA) 108 (90 Base) MCG/ACT inhaler Inhale 2 puffs into the lungs every 6 (six) hours as needed for wheezing or shortness of breath.   Yes [provider]  ascorbic acid (VITAMIN C) 500 MG tablet Take 1 tablet (500 mg total) by mouth daily. 08/28/19   Mariah Hartshorn, MD  zinc sulfate 220 (50 Zn) MG capsule Take 1 capsule (220 mg total) by mouth daily. 08/28/19   Mariah Hartshorn, MD    Physical Exam: Vitals:   09/01/19 2200 09/01/19 2237 09/01/19 2240 09/01/19 2327  BP: 130/89 (!)  114/103 (!) 114/103 (!) 131/111  Pulse: 80 94 92 70  Resp: (!) 25 (!) 21 (!) 33 20  Temp:  98.4 F (36.9 C)    TempSrc:  Oral    SpO2: 97% 99% 97% 98%    Constitutional: NAD, calm, comfortable Eyes: PERRL, lids and conjunctivae normal ENMT: Mucous membranes are moist. Posterior pharynx clear of any exudate or lesions.Normal dentition.  Neck: normal, supple, no masses, no thyromegaly Respiratory: clear to auscultation bilaterally, no wheezing, no crackles. Normal respiratory effort. No accessory muscle use.  Cardiovascular: Regular rate and rhythm, no murmurs / rubs / gallops. No extremity edema. 2+ pedal pulses. No carotid bruits.  Abdomen: no tenderness, no masses palpated. No hepatosplenomegaly. Bowel sounds positive.  Musculoskeletal: no clubbing / cyanosis. No joint deformity upper and lower extremities. Good ROM, no contractures. Normal muscle tone.  Skin: no rashes, lesions, ulcers. No induration Neurologic: CN 2-12 grossly intact. Sensation intact, DTR normal. Strength 5/5 in all 4.  Psychiatric: Normal judgment and insight. Alert and oriented x 3. Normal mood.    Labs on Admission: I have personally reviewed following labs and imaging studies  CBC: Recent Labs  Lab 08/26/19 0355 08/27/19 0444 09/01/19 2140  WBC 5.0 6.1 18.7*  NEUTROABS 3.9 5.0 16.9*  HGB 10.7* 11.1* 10.4*  HCT 33.3* 35.0* 31.8*  MCV 87.6 88.4 86.2  PLT 260 355  300   Basic Metabolic Panel: Recent Labs  Lab 08/26/19 0355 08/27/19 0444 09/01/19 2140  NA 139 141 137  K 4.2 3.9 4.1  CL 105 106 101  CO2 24 25 24   GLUCOSE 125* 144* 132*  BUN 12 14 9   CREATININE 0.64 0.65 0.75  CALCIUM 8.1* 8.1* 8.4*  MG 2.5* 2.6*  --   PHOS 2.8 3.7  --    GFR: Estimated Creatinine Clearance: 145 mL/min (by C-G formula based on SCr of 0.75 mg/dL). Liver Function Tests: Recent Labs  Lab 08/26/19 0355 08/27/19 0444  AST 40 23  ALT 38 31  ALKPHOS 32* 36*  BILITOT 0.6 0.7  PROT 6.6 6.6  ALBUMIN 3.1* 3.1*     No results for input(s): LIPASE, AMYLASE in the last 168 hours. No results for input(s): AMMONIA in the last 168 hours. Coagulation Profile: No results for input(s): INR, PROTIME in the last 168 hours. Cardiac Enzymes: No results for input(s): CKTOTAL, CKMB, CKMBINDEX, TROPONINI in the last 168 hours. BNP (last 3 results) No results for input(s): PROBNP in the last 8760 hours. HbA1C: No results for input(s): HGBA1C in the last 72 hours. CBG: No results for input(s): GLUCAP in the last 168 hours. Lipid Profile: No results for input(s): CHOL, HDL, LDLCALC, TRIG, CHOLHDL, LDLDIRECT in the last 72 hours. Thyroid Function Tests: No results for input(s): TSH, T4TOTAL, FREET4, T3FREE, THYROIDAB in the last 72 hours. Anemia Panel: No results for input(s): VITAMINB12, FOLATE, FERRITIN, TIBC, IRON, RETICCTPCT in the last 72 hours. Urine analysis:    Component Value Date/Time   COLORURINE YELLOW 08/24/2019 2050   APPEARANCEUR HAZY (A) 08/24/2019 2050   LABSPEC 1.020 08/24/2019 2050   PHURINE 6.0 08/24/2019 2050   GLUCOSEU NEGATIVE 08/24/2019 2050   HGBUR LARGE (A) 08/24/2019 2050   BILIRUBINUR NEGATIVE 08/24/2019 2050   KETONESUR 20 (A) 08/24/2019 2050   PROTEINUR 30 (A) 08/24/2019 2050   NITRITE NEGATIVE 08/24/2019 2050   LEUKOCYTESUR NEGATIVE 08/24/2019 2050    Radiological Exams on Admission: CT Angio Chest PE W and/or Wo Contrast  Result Date: 09/01/2019 CLINICAL DATA:  COVID positive with fever and vomiting. EXAM: CT ANGIOGRAPHY CHEST WITH CONTRAST TECHNIQUE: Multidetector CT imaging of the chest was performed using the standard protocol during bolus administration of intravenous contrast. Multiplanar CT image reconstructions and MIPs were obtained to evaluate the vascular anatomy. CONTRAST:  08/26/2019 OMNIPAQUE IOHEXOL 350 MG/ML SOLN COMPARISON:  August 25, 2019 FINDINGS: Cardiovascular: A mild amount of intraluminal low attenuation is seen involving multiple lower lobe branches of  the bilateral pulmonary arteries. Normal heart size. No pericardial effusion. Mediastinum/Nodes: No enlarged mediastinal, hilar, or axillary lymph nodes. Thyroid gland, trachea, and esophagus demonstrate no significant findings. Lungs/Pleura: Marked severity patchy multifocal infiltrates are seen scattered throughout both lungs. There is no evidence of a pleural effusion or pneumothorax. Upper Abdomen: No acute abnormality. Musculoskeletal: No chest wall abnormality. No acute or significant osseous findings. Review of the MIP images confirms the above findings. IMPRESSION: 1. Mild pulmonary embolism involving multiple lower lobe branches of the bilateral pulmonary arteries. 2. Marked severity patchy bilateral multifocal infiltrates. Electronically Signed   By: M.D.   On: 09/01/2019 22:57   DG Chest Port 1 View  Result Date: 09/01/2019 CLINICAL DATA:  Shortness of breath, cough and COVID positive. EXAM: PORTABLE CHEST 1 VIEW COMPARISON:  09/01/2019 and prior radiographs FINDINGS: Diffuse bilateral airspace opacities are not significantly changed. Cardiomegaly again noted. No pleural effusion or pneumothorax. No acute bony  abnormalities are present. IMPRESSION: Unchanged diffuse bilateral airspace opacities compatible with infection/pneumonia. Electronically Signed   By: Harmon Pier M.D.   On: 09/01/2019 18:46    EKG: Independently reviewed.  Assessment/Plan Principal Problem:   Acute pulmonary embolism (HCC) Active Problems:   Acute respiratory disease due to COVID-19 virus    1. Acute PE - 1. Provoked by recent COVID illness 2. Heparin gtt 3. Tele monitor 4. Cont pulse ox 5. 2d echo 6. Check trop 7. US DVT BLE 2. COVID-19 1. Just finished steroids 2. Completed remdesivir 3. Multifocal PNA still present 4. Still on 3L O2 3. Leukocytosis - 1. ? Demargination from prednisone which she just finished today 2. Repeat CBC in AM to trend  DVT prophylaxis: Heparin gtt Code  Status: Full Family Communication: No family in room Disposition Plan: Home after admit Consults called: None Admission status: Place in 75   Marcella Charlson M. DO [facility]  How to contact the [facility] Attending or Consulting provider 7A - 7P or covering provider during after hours 7P -7A, for this patient?  1. Check the care team in [facility] and look for a) attending/consulting [facility] provider listed and b) the [facility] team listed 2. Log into [email or web address]  Amion Physician Scheduling and messaging for groups and whole hospitals  On call and physician scheduling software for group practices, residents, hospitalists and other medical providers for call, clinic, rotation and shift schedules. On[employer] is a hospital-wide system for scheduling doctors and paging doctors on call. EasyPlot is for scientific plotting and data analysis.  [email or web address]  and use [place]'s universal password to access. If you do not have the password, please contact the hospital operator.  3. Locate the [facility] provider you are looking for under [facility] and page to a number that you can be directly reached. 4. If you still have difficulty reaching the provider, please page the [facility] (Director on Call) for the Hospitalists listed on amion for assistance.  09/02/2019, 12:07 AM

## 2019-09-02 NOTE — Progress Notes (Signed)
ANTICOAGULATION CONSULT NOTE  Pharmacy Consult for Heparin Indication: pulmonary embolus  Allergies  Allergen Reactions  . Hydrocortisone-Emollient Hives    Patient Measurements:   Heparin Dosing Weight: 86kg  Vital Signs: Temp: 97.6 F (36.4 C) (08/23 1044) Temp Source: Oral (08/23 1044) BP: 114/64 (08/23 1044) Pulse Rate: 81 (08/23 1044)  Labs: Recent Labs    09/01/19 2140 09/01/19 2340 09/02/19 0601 09/02/19 1241  HGB 10.4*  --  9.9*  --   HCT 31.8*  --  30.3*  --   PLT 300  --  308  --   HEPARINUNFRC  --   --  0.39 0.12*  CREATININE 0.75  --  0.86  --   TROPONINIHS  --  3  --   --     Estimated Creatinine Clearance: 134.9 mL/min (by C-G formula based on SCr of 0.86 mg/dL).   Medical History: Past Medical History:  Diagnosis Date  . Asthma   . Eczema   . Obesity     Medications:  Infusions:  . heparin 1,700 Units/hr (09/01/19 2348)   Assessment: 19 yo F recently diagnosed with COVID PNA presents with increasing shortness of breath and hemoptysis.  Chest CT + PE. Not on blood thinners PTA Baseline CBC- Hg 10.4, pltc 300  09/02/2019: -CBC: Hg slightly low 9.9, pltc WNL/stable -No bleeding or infusion related concerns noted -1241 HL 0.12, subtherapeutic on 1700 units/hr - per d/w RN no infusion issues but pt has had more hemoptysis  Goal of Therapy:  Heparin level 0.3-0.7 units/ml Monitor platelets by anticoagulation protocol: Yes   Plan:  Increase heparin drip to 2000 units/hr, will not bolus due to hemoptysis Check 6h heparin level  Daily heparin level & CBC while on heparin F/U long-term anticoagulation plans  Arley Phenix RPh 09/02/2019, 1:35 PM

## 2019-09-02 NOTE — Progress Notes (Signed)
Bilateral lower extremity venous duplex has been completed. Preliminary results can be found in CV Proc through chart review.  Results were given to the patient's nurse, Lorren.  09/02/19 2:01 PM Olen Cordial RVT

## 2019-09-02 NOTE — ED Notes (Signed)
Assumed care of patient at this time, nad noted, sr up x2, bed locked and low, call bell w/I reach.  Will continue to monitor. ° °

## 2019-09-02 NOTE — Progress Notes (Signed)
Echocardiogram 2D Echocardiogram has been performed.  [name] 09/02/2019, 11:35 AM

## 2019-09-02 NOTE — ED Notes (Addendum)
Pt did ambulatory O2 sat and still became SOB. O2 sat stayed above 93% on 3L.

## 2019-09-02 NOTE — Progress Notes (Signed)
ANTICOAGULATION CONSULT NOTE  Pharmacy Consult for Heparin Indication: pulmonary embolus  Allergies  Allergen Reactions  . Hydrocortisone-Emollient Hives    Patient Measurements:   Heparin Dosing Weight: 86kg  Vital Signs: Temp: 97.9 F (36.6 C) (08/23 0601) Temp Source: Oral (08/23 0601) BP: 105/57 (08/23 0535) Pulse Rate: 85 (08/23 0535)  Labs: Recent Labs    09/01/19 2140 09/01/19 2340 09/02/19 0601  HGB 10.4*  --  9.9*  HCT 31.8*  --  30.3*  PLT 300  --  308  HEPARINUNFRC  --   --  0.39  CREATININE 0.75  --   --   TROPONINIHS  --  3  --     Estimated Creatinine Clearance: 145 mL/min (by C-G formula based on SCr of 0.75 mg/dL).   Medical History: Past Medical History:  Diagnosis Date  . Asthma   . Eczema   . Obesity     Medications:  Infusions:  . heparin 1,700 Units/hr (09/01/19 2348)   Assessment: 19 yo F recently diagnosed with COVID PNA presents with increasing shortness of breath and hemoptysis.  Chest CT + PE. Not on blood thinners PTA Baseline CBC- Hg 10.4, pltc 300  09/02/2019: -Initial heparin level therapeutic- 0.39 -CBC: Hg slightly low 9.9, pltc WNL/stable -No bleeding or infusion related concerns noted  Goal of Therapy:  Heparin level 0.3-0.7 units/ml Monitor platelets by anticoagulation protocol: Yes   Plan:  Continue heparin infusion at 1700 units/hr Check 6h heparin level to confirm at steady-state Daily heparin level & CBC while on heparin F/U long-term anticoagulation plans  Junita Push PharmD, BCPS 09/02/2019,6:35 AM

## 2019-09-02 NOTE — ED Notes (Signed)
Lunch tray delivered.

## 2019-09-02 NOTE — Progress Notes (Signed)
ANTICOAGULATION CONSULT NOTE  Pharmacy Consult for Heparin Indication: pulmonary embolus  Allergies  Allergen Reactions  . Hydrocortisone-Emollient Hives    Patient Measurements:   Heparin Dosing Weight: 86kg  Vital Signs: Temp: 97.6 F (36.4 C) (08/23 1457) Temp Source: Oral (08/23 1457) BP: 122/101 (08/23 2021) Pulse Rate: 64 (08/23 2021)  Labs: Recent Labs    09/01/19 2140 09/01/19 2340 09/02/19 0601 09/02/19 1241 09/02/19 2018  HGB 10.4*  --  9.9*  --   --   HCT 31.8*  --  30.3*  --   --   PLT 300  --  308  --   --   HEPARINUNFRC  --   --  0.39 0.12* 0.60  CREATININE 0.75  --  0.86  --   --   TROPONINIHS  --  3  --   --   --     Estimated Creatinine Clearance: 134.9 mL/min (by C-G formula based on SCr of 0.86 mg/dL).   Medical History: Past Medical History:  Diagnosis Date  . Asthma   . Eczema   . Obesity     Medications:  Infusions:  . heparin 2,000 Units/hr (09/02/19 1341)   Assessment: 19 yo F recently diagnosed with COVID PNA presents with increasing shortness of breath and hemoptysis.  Chest CT + PE. Not on blood thinners PTA Baseline CBC- Hg 10.4, pltc 300  09/02/2019:  Heparin level now therapeutic after increasing rate from 1700 to 2000 units/hr earlier today  Patient noted to have had some hemoptysis earlier  Goal of Therapy:  Heparin level 0.3-0.7 units/ml Monitor platelets by anticoagulation protocol: Yes   Plan:   Continue current IV heparin rate of 2000 units/hr  Recheck heparin level at 0200 8/24  Daily heparin level & CBC while on heparin  F/U long-term anticoagulation plans   Hessie Knows, PharmD, BCPS 09/02/2019 9:20 PM

## 2019-09-02 NOTE — Progress Notes (Signed)
PROGRESS NOTE  [name] Joens KCL:[id number] DOB: May 18, 2000 DOA: 09/01/2019  PCP: Patient, No Pcp Per  Brief History/Interval Summary: 19 y.o. female with medical history significant of recent admit for COVID PNA.  Discharged on 8/17 on 3.5L O2.  She finished course of prednisone on 8/22.  Came in with ongoing difficulty breathing but more so due to pain in the left side of the chest and hemoptysis.  Evaluation revealed pulmonary embolism.  Was hospitalized for further management.  Reason for Visit: Acute pulmonary embolism in the setting of COVID-19  Consultants: None  Procedures: Transthoracic echocardiogram has been ordered.  Lower extremity Doppler studies have been ordered.  Antibiotics: Anti-infectives (From admission, onward)   None      Subjective/Interval History: Patient complains of 5 out of 10 pain in the left chest.  Improved from yesterday.  Has not had any further episodes of bloody sputum.  Shortness of breath is about the same.  No nausea vomiting.     Assessment/Plan:  Acute pulmonary embolism This is in the setting of recent COVID-19.  Patient likely was hypercoagulable due to this infection.  No other risk factors identified except for obesity.  Patient started on IV heparin.  Follow-up on lower extremity Doppler studies and echocardiogram.  If she remains stable she will be transition to Xarelto tomorrow.  Pneumonia due to COVID-19/acute hypoxic respiratory failure. Recent hospitalization for same.  She completed course of Remdesivir and steroids.  She was discharged on 3 L of oxygen.  Currently saturating in the mid 90s on 3 L.  Anticipate we will be able to wean this down.  No need to resume steroids.  Continue with incentive spirometry and mobilization.  Continue with oxygen for now.  Leukocytosis Most likely due to steroids.  She is afebrile.  Continue to monitor.  Normocytic anemia Hemoglobin about the same as what it was last week.  No evidence of  overt bleeding.  Ferritin was 120 on August 17.  Will check anemia panel.  Obesity Estimated body mass index is 41.16 kg/m as calculated from the following:   Height as of 08/24/19: 5\' 6"  (1.676 m).   Weight as of 08/24/19: 115.7 kg.   DVT Prophylaxis: On heparin infusion Code Status: Full code Family Communication: Discussed with the patient Disposition Plan: Return home when improved  Status is: Inpatient  Remains inpatient appropriate because:IV treatments appropriate due to intensity of illness or inability to take PO and Inpatient level of care appropriate due to severity of illness   Dispo: The patient is from: Home              Anticipated d/c is to: Home              Anticipated d/c date is: 2 days              Patient currently is not medically stable to d/c.       Medications:  Scheduled: . ascorbic acid  500 mg Oral Daily  . zinc sulfate  220 mg Oral Daily   Continuous: . heparin 1,700 Units/hr (09/01/19 2348)   09/03/19 **OR** acetaminophen, albuterol, benzonatate, guaiFENesin-dextromethorphan, HYDROcodone-acetaminophen, ondansetron **OR** ondansetron (ZOFRAN) IV   Objective:  Vital Signs  Vitals:   09/02/19 0911 09/02/19 1000 09/02/19 1019 09/02/19 1044  BP:  111/68 111/68 114/64  Pulse:  75 67 81  Resp:  18 (!) 21 (!) 26  Temp: 97.6 F (36.4 C)  97.6 F (36.4 C) 97.6 F (36.4 C)  TempSrc: Oral  Oral Oral  SpO2:  97% 92% 96%   No intake or output data in the 24 hours ending 09/02/19 1111 There were no vitals filed for this visit.  General appearance: Awake alert.  In no distress Resp: Normal effort at rest.  Coarse breath sounds with crackles bilateral bases.  No wheezing or rhonchi. Cardio: S1-S2 is normal regular.  No S3-S4.  No rubs murmurs or bruit GI: Abdomen is soft.  Nontender nondistended.  Bowel sounds are present normal.  No masses organomegaly Extremities: No edema.  Full range of motion of lower extremities. Neurologic:  Alert and oriented x3.  No focal neurological deficits.    Lab Results:  Data Reviewed: I have personally reviewed following labs and imaging studies  CBC: Recent Labs  Lab 08/27/19 0444 09/01/19 2140 09/02/19 0601  WBC 6.1 18.7* 18.3*  NEUTROABS 5.0 16.9*  --   HGB 11.1* 10.4* 9.9*  HCT 35.0* 31.8* 30.3*  MCV 88.4 86.2 87.3  PLT 355 300 308    Basic Metabolic Panel: Recent Labs  Lab 08/27/19 0444 09/01/19 2140 09/02/19 0601  NA 141 137 139  K 3.9 4.1 3.7  CL 106 101 101  CO2 25 24 26   GLUCOSE 144* 132* 147*  BUN 14 9 9   CREATININE 0.65 0.75 0.86  CALCIUM 8.1* 8.4* 8.7*  MG 2.6*  --   --   PHOS 3.7  --   --     GFR: Estimated Creatinine Clearance: 134.9 mL/min (by C-G formula based on SCr of 0.86 mg/dL).  Liver Function Tests: Recent Labs  Lab 08/27/19 0444  AST 23  ALT 31  ALKPHOS 36*  BILITOT 0.7  PROT 6.6  ALBUMIN 3.1*      Recent Results (from the past 240 hour(s))  Blood Culture (routine x 2)     Status: None   Collection Time: 08/24/19  5:59 PM   Specimen: Left Antecubital; Blood  Result Value Ref Range Status   Specimen Description LEFT ANTECUBITAL  Final   Special Requests   Final    BOTTLES DRAWN AEROBIC AND ANAEROBIC Blood Culture adequate volume   Culture   Final    NO GROWTH 5 DAYS Performed at [facility], [address], [place], [address] [place]    Report Status 08/29/2019 FINAL  Final  Blood Culture (routine x 2)     Status: None   Collection Time: 08/24/19  5:59 PM   Specimen: BLOOD RIGHT HAND  Result Value Ref Range Status   Specimen Description BLOOD RIGHT HAND  Final   Special Requests   Final    BOTTLES DRAWN AEROBIC AND ANAEROBIC Blood Culture adequate volume   Culture   Final    NO GROWTH 5 DAYS Performed at [facility], [address] [place], [address] [place]    Report Status 08/29/2019 FINAL  Final      Radiology Studies: CT Angio Chest PE W and/or Wo Contrast  Result Date: 09/01/2019 CLINICAL DATA:   COVID positive with fever and vomiting. EXAM: CT ANGIOGRAPHY CHEST WITH CONTRAST TECHNIQUE: Multidetector CT imaging of the chest was performed using the standard protocol during bolus administration of intravenous contrast. Multiplanar CT image reconstructions and MIPs were obtained to evaluate the vascular anatomy. CONTRAST:  12-26-1991 OMNIPAQUE IOHEXOL 350 MG/ML SOLN COMPARISON:  August 25, 2019 FINDINGS: Cardiovascular: A mild amount of intraluminal low attenuation is seen involving multiple lower lobe branches of the bilateral pulmonary arteries. Normal heart size. No pericardial effusion. Mediastinum/Nodes: No  enlarged mediastinal, hilar, or axillary lymph nodes. Thyroid gland, trachea, and esophagus demonstrate no significant findings. Lungs/Pleura: Marked severity patchy multifocal infiltrates are seen scattered throughout both lungs. There is no evidence of a pleural effusion or pneumothorax. Upper Abdomen: No acute abnormality. Musculoskeletal: No chest wall abnormality. No acute or significant osseous findings. Review of the MIP images confirms the above findings. IMPRESSION: 1. Mild pulmonary embolism involving multiple lower lobe branches of the bilateral pulmonary arteries. 2. Marked severity patchy bilateral multifocal infiltrates. Electronically Signed   By: Aram Candela M.D.   On: 09/01/2019 22:57   DG Chest Port 1 View  Result Date: 09/01/2019 CLINICAL DATA:  Shortness of breath, cough and COVID positive. EXAM: PORTABLE CHEST 1 VIEW COMPARISON:  09/01/2019 and prior radiographs FINDINGS: Diffuse bilateral airspace opacities are not significantly changed. Cardiomegaly again noted. No pleural effusion or pneumothorax. No acute bony abnormalities are present. IMPRESSION: Unchanged diffuse bilateral airspace opacities compatible with infection/pneumonia. Electronically Signed   By: Harmon Pier M.D.   On: 09/01/2019 18:46       LOS: 0 days   Braeden Dolinski Rito Ehrlich  [facility] Pager on  [email or web address]  09/02/2019, 11:11 AM

## 2019-09-03 ENCOUNTER — Inpatient Hospital Stay (HOSPITAL_COMMUNITY): Payer: Self-pay

## 2019-09-03 DIAGNOSIS — D649 Anemia, unspecified: Secondary | ICD-10-CM

## 2019-09-03 DIAGNOSIS — R042 Hemoptysis: Secondary | ICD-10-CM

## 2019-09-03 DIAGNOSIS — I82402 Acute embolism and thrombosis of unspecified deep veins of left lower extremity: Secondary | ICD-10-CM

## 2019-09-03 DIAGNOSIS — N920 Excessive and frequent menstruation with regular cycle: Secondary | ICD-10-CM

## 2019-09-03 LAB — CBC
HCT: 29.9 % — ABNORMAL LOW (ref 36.0–46.0)
Hemoglobin: 9.4 g/dL — ABNORMAL LOW (ref 12.0–15.0)
MCH: 28.1 pg (ref 26.0–34.0)
MCHC: 31.4 g/dL (ref 30.0–36.0)
MCV: 89.3 fL (ref 80.0–100.0)
Platelets: 370 10*3/uL (ref 150–400)
RBC: 3.35 MIL/uL — ABNORMAL LOW (ref 3.87–5.11)
RDW: 13.1 % (ref 11.5–15.5)
WBC: 14.4 10*3/uL — ABNORMAL HIGH (ref 4.0–10.5)
nRBC: 0 % (ref 0.0–0.2)

## 2019-09-03 LAB — BASIC METABOLIC PANEL
Anion gap: 13 (ref 5–15)
BUN: 16 mg/dL (ref 6–20)
CO2: 24 mmol/L (ref 22–32)
Calcium: 8.4 mg/dL — ABNORMAL LOW (ref 8.9–10.3)
Chloride: 101 mmol/L (ref 98–111)
Creatinine, Ser: 1 mg/dL (ref 0.44–1.00)
GFR calc Af Amer: >60 mL/min (ref >60)
GFR calc non Af Amer: >60 mL/min (ref >60)
Glucose, Bld: 92 mg/dL (ref 70–99)
Potassium: 3.7 mmol/L (ref 3.5–5.1)
Sodium: 138 mmol/L (ref 135–145)

## 2019-09-03 LAB — FERRITIN: Ferritin: 260 ng/mL (ref 11–307)

## 2019-09-03 LAB — HEPARIN LEVEL (UNFRACTIONATED): Heparin Unfractionated: 0.49 IU/mL (ref 0.30–0.70)

## 2019-09-03 LAB — RETICULOCYTES
Immature Retic Fract: 17.1 % — ABNORMAL HIGH (ref 2.3–15.9)
RBC.: 3.35 MIL/uL — ABNORMAL LOW (ref 3.87–5.11)
Retic Count, Absolute: 120.6 10*3/uL (ref 19.0–186.0)
Retic Ct Pct: 3.6 % — ABNORMAL HIGH (ref 0.4–3.1)

## 2019-09-03 LAB — HEMOGLOBIN AND HEMATOCRIT, BLOOD
HCT: 29.9 % — ABNORMAL LOW (ref 36.0–46.0)
Hemoglobin: 9.4 g/dL — ABNORMAL LOW (ref 12.0–15.0)

## 2019-09-03 LAB — IRON AND TIBC
Iron: 50 ug/dL (ref 28–170)
Saturation Ratios: 22 % (ref 10.4–31.8)
TIBC: 227 ug/dL — ABNORMAL LOW (ref 250–450)
UIBC: 177 ug/dL

## 2019-09-03 LAB — FOLATE: Folate: 4.3 ng/mL — ABNORMAL LOW (ref >5.9)

## 2019-09-03 LAB — VITAMIN B12: Vitamin B-12: 384 pg/mL (ref 180–914)

## 2019-09-03 MED ORDER — BENZONATATE 100 MG PO CAPS
200.0000 mg | ORAL_CAPSULE | Freq: Three times a day (TID) | ORAL | Status: DC
  Administered 2019-09-03 – 2019-09-07 (×13): 200 mg via ORAL
  Filled 2019-09-03 (×12): qty 2

## 2019-09-03 MED ORDER — HYDROCOD POLST-CPM POLST ER 10-8 MG/5ML PO SUER
5.0000 mL | Freq: Two times a day (BID) | ORAL | Status: DC | PRN
  Administered 2019-09-04 – 2019-09-06 (×3): 5 mL via ORAL
  Filled 2019-09-03 (×4): qty 5

## 2019-09-03 MED ORDER — HYDROCODONE-ACETAMINOPHEN 5-325 MG PO TABS
1.0000 | ORAL_TABLET | Freq: Four times a day (QID) | ORAL | Status: DC | PRN
  Administered 2019-09-04 – 2019-09-06 (×3): 2 via ORAL
  Filled 2019-09-03 (×3): qty 2

## 2019-09-03 MED ORDER — MORPHINE SULFATE (PF) 2 MG/ML IV SOLN
2.0000 mg | Freq: Once | INTRAVENOUS | Status: AC
  Administered 2019-09-03: 2 mg via INTRAVENOUS
  Filled 2019-09-03: qty 1

## 2019-09-03 MED ORDER — HYDROMORPHONE HCL 1 MG/ML IJ SOLN
1.0000 mg | INTRAMUSCULAR | Status: DC | PRN
  Administered 2019-09-03 – 2019-09-05 (×6): 1 mg via INTRAVENOUS
  Filled 2019-09-03 (×6): qty 1

## 2019-09-03 MED ORDER — FOLIC ACID 1 MG PO TABS
1.0000 mg | ORAL_TABLET | Freq: Every day | ORAL | Status: DC
  Administered 2019-09-03 – 2019-09-07 (×5): 1 mg via ORAL
  Filled 2019-09-03 (×5): qty 1

## 2019-09-03 MED ORDER — MORPHINE SULFATE (PF) 2 MG/ML IV SOLN: 2.0000 mg | INTRAVENOUS | Status: DC | PRN

## 2019-09-03 NOTE — ED Notes (Signed)
Patient is resting comfortably. 

## 2019-09-03 NOTE — Progress Notes (Signed)
PROGRESS NOTE  [name] WUJ:[id number] DOB: 05-11-2000 DOA: 09/01/2019  PCP: Patient, No Pcp Per  Brief History/Interval Summary: 19 y.o. female with medical history significant of recent admit for COVID PNA.  Discharged on 8/17 on 3.5L O2.  She finished course of prednisone on 8/22.  Came in with ongoing difficulty breathing but more so due to pain in the left side of the chest and hemoptysis.  Evaluation revealed pulmonary embolism.  Was hospitalized for further management.  Reason for Visit: Acute pulmonary embolism in the setting of COVID-19  Consultants: None  Procedures:  Transthoracic echocardiogram Lower extremity Doppler  Antibiotics: Anti-infectives (From admission, onward)   Start     Dose/Rate Route Frequency Ordered Stop   09/02/19 2145  remdesivir 100 mg in sodium chloride 0.9 % 100 mL IVPB        100 mg 200 mL/hr over 30 Minutes Intravenous  Once 09/02/19 2131 09/03/19 0147      Subjective/Interval History: Patient mentions that she is experiencing more pain in the left side of her chest.  Currently about 7 out of 10 in intensity.  Sharp pain worse with deep breathing.  Also mentions that she has been coughing up a lot more blood in her sputum.  Has had a couple episodes of vomiting as well.  No blood in the emesis.  She also mentions that she started having her menstrual period yesterday and has noticed heavy bleeding.    Assessment/Plan:  Acute pulmonary embolism/Acute DVT in left lower extremity/hemoptysis This is in the setting of recent COVID-19.  Patient likely was hypercoagulable due to this infection.  No other risk factors identified except for obesity.  Patient started on IV heparin.   Lower extremity Doppler showed acute DVT in the left leg.  Echocardiogram report reviewed.  Left ventricular function is normal.  RV was poorly visualized.  No other concerning changes noted.   Patient continues to have significant hemoptysis.  Chest x-ray was  repeated and does not show any new findings.  In view of this and due to concern for menorrhagia we will delay transition to Xarelto till tomorrow.    Pneumonia due to COVID-19/acute hypoxic respiratory failure. Recent hospitalization for same.  She completed course of Remdesivir and steroids.  She was discharged on 3 L of oxygen.  Currently requiring 2 L of oxygen.  Continue supportive care.  Incentive spirometry mobilization.   Leukocytosis Most likely due to steroids.  She is afebrile.  Continue to monitor.   Menorrhagia Patient apparently started her menstrual cycle yesterday.  Reports heavy bleeding.  Leave her on IV heparin for now.  Recheck her hemoglobin later today and tomorrow.  If her menstrual bleeding does not subside may have to discuss with GYN and place her on medications to slow this down.  Normocytic anemia Hemoglobin about the same as what it was last week.  Anemia panel reviewed.  Ferritin 260, iron 50, TIBC 227.  B12 384.  Folate noted to be low at 4.3.  This will be supplemented.  Obesity Estimated body mass index is 41.16 kg/m as calculated from the following:   Height as of 08/24/19: 5\' 6"  (1.676 m).   Weight as of 08/24/19: 115.7 kg.   DVT Prophylaxis: On heparin infusion Code Status: Full code Family Communication: Discussed with the patient Disposition Plan: Return home when improved  Status is: Inpatient  Remains inpatient appropriate because:IV treatments appropriate due to intensity of illness or inability to take PO and Inpatient level  of care appropriate due to severity of illness   Dispo:  Patient From: Home  Planned Disposition: Home  Expected discharge date: 09/04/19  Medically stable for discharge: No     Medications:  Scheduled: . ascorbic acid  500 mg Oral Daily  . benzonatate  200 mg Oral TID  . folic acid  1 mg Oral Daily  . zinc sulfate  220 mg Oral Daily   Continuous: . sodium chloride    . famotidine (PEPCID) IV    . heparin  2,000 Units/hr (09/03/19 0153)   CXK:GYJEHU chloride, acetaminophen **OR** acetaminophen, albuterol, chlorpheniramine-HYDROcodone, diphenhydrAMINE, EPINEPHrine, famotidine (PEPCID) IV, HYDROcodone-acetaminophen, methylPREDNISolone (SOLU-MEDROL) injection, morphine injection, ondansetron **OR** ondansetron (ZOFRAN) IV   Objective:  Vital Signs  Vitals:   09/03/19 1030 09/03/19 1045 09/03/19 1100 09/03/19 1205  BP: (!) 119/57  123/71 (!) 143/81  Pulse: 97 83 76 85  Resp: (!) 28 (!) 24 10 16   Temp:      TempSrc:      SpO2:  96% 97% 96%    Intake/Output Summary (Last 24 hours) at 09/03/2019 1206 Last data filed at 09/03/2019 0147 Gross per 24 hour  Intake 100 ml  Output --  Net 100 ml   There were no vitals filed for this visit.  General appearance: Awake alert.  In no distress.  Obese Resp: Poor effort.  Diminished air entry at the bases.  Crackles bilateral.  No wheezing or rhonchi.   Cardio: S1-S2 is normal regular.  No S3-S4.  No rubs murmurs or bruit GI: Abdomen is soft.  Nontender nondistended.  Bowel sounds are present normal.  No masses organomegaly Extremities: No edema.  Full range of motion of lower extremities. Neurologic: Alert and oriented x3.  No focal neurological deficits.     Lab Results:  Data Reviewed: I have personally reviewed following labs and imaging studies  CBC: Recent Labs  Lab 09/01/19 2140 09/02/19 0601 09/03/19 0415  WBC 18.7* 18.3* 14.4*  NEUTROABS 16.9*  --   --   HGB 10.4* 9.9* 9.4*  HCT 31.8* 30.3* 29.9*  MCV 86.2 87.3 89.3  PLT 300 308 370    Basic Metabolic Panel: Recent Labs  Lab 09/01/19 2140 09/02/19 0601 09/03/19 0415  NA 137 139 138  K 4.1 3.7 3.7  CL 101 101 101  CO2 24 26 24   GLUCOSE 132* 147* 92  BUN 9 9 16   CREATININE 0.75 0.86 1.00  CALCIUM 8.4* 8.7* 8.4*    GFR: Estimated Creatinine Clearance: 116 mL/min (by C-G formula based on SCr of 1 mg/dL).   Recent Results (from the past 240 hour(s))  Blood  Culture (routine x 2)     Status: None   Collection Time: 08/24/19  5:59 PM   Specimen: Left Antecubital; Blood  Result Value Ref Range Status   Specimen Description LEFT ANTECUBITAL  Final   Special Requests   Final    BOTTLES DRAWN AEROBIC AND ANAEROBIC Blood Culture adequate volume   Culture   Final    NO GROWTH 5 DAYS Performed at [facility], [address], [place], [facility] [address]    Report Status 08/29/2019 FINAL  Final  Blood Culture (routine x 2)     Status: None   Collection Time: 08/24/19  5:59 PM   Specimen: BLOOD RIGHT HAND  Result Value Ref Range Status   Specimen Description BLOOD RIGHT HAND  Final   Special Requests   Final    BOTTLES DRAWN AEROBIC AND ANAEROBIC Blood Culture  adequate volume   Culture   Final    NO GROWTH 5 DAYS Performed at [facility], [address] [place], [place] [address]    Report Status 08/29/2019 FINAL  Final      Radiology Studies: CT Angio Chest PE W and/or Wo Contrast  Result Date: 09/01/2019 CLINICAL DATA:  COVID positive with fever and vomiting. EXAM: CT ANGIOGRAPHY CHEST WITH CONTRAST TECHNIQUE: Multidetector CT imaging of the chest was performed using the standard protocol during bolus administration of intravenous contrast. Multiplanar CT image reconstructions and MIPs were obtained to evaluate the vascular anatomy. CONTRAST:  OMNIPAQUE IOHEXOL 350 MG/ML SOLN COMPARISON:  August 25, 2019 FINDINGS: Cardiovascular: A mild amount of intraluminal low attenuation is seen involving multiple lower lobe branches of the bilateral pulmonary arteries. Normal heart size. No pericardial effusion. Mediastinum/Nodes: No enlarged mediastinal, hilar, or axillary lymph nodes. Thyroid gland, trachea, and esophagus demonstrate no significant findings. Lungs/Pleura: Marked severity patchy multifocal infiltrates are seen scattered throughout both lungs. There is no evidence of a pleural effusion or pneumothorax. Upper Abdomen: No acute abnormality.  Musculoskeletal: No chest wall abnormality. No acute or significant osseous findings. Review of the MIP images confirms the above findings. IMPRESSION: 1. Mild pulmonary embolism involving multiple lower lobe branches of the bilateral pulmonary arteries. 2. Marked severity patchy bilateral multifocal infiltrates. Electronically Signed   By: Aram Candela M.D.   On: 09/01/2019 22:57   DG Chest Port 1 View  Result Date: 09/03/2019 CLINICAL DATA:  COVID-19. EXAM: PORTABLE CHEST 1 VIEW COMPARISON:  September 01, 2019. FINDINGS: Stable cardiomediastinal silhouette. No pneumothorax pleural effusion is noted. Stable bilateral lung opacities are noted consistent with multifocal pneumonia. Bony thorax is unremarkable. IMPRESSION: Stable bilateral lung opacities consistent with multifocal pneumonia. Electronically Signed   By: Lupita Raider M.D.   On: 09/03/2019 08:28   DG Chest Port 1 View  Result Date: 09/01/2019 CLINICAL DATA:  Shortness of breath, cough and COVID positive. EXAM: PORTABLE CHEST 1 VIEW COMPARISON:  09/01/2019 and prior radiographs FINDINGS: Diffuse bilateral airspace opacities are not significantly changed. Cardiomegaly again noted. No pleural effusion or pneumothorax. No acute bony abnormalities are present. IMPRESSION: Unchanged diffuse bilateral airspace opacities compatible with infection/pneumonia. Electronically Signed   By: Harmon Pier M.D.   On: 09/01/2019 18:46   ECHOCARDIOGRAM COMPLETE  Result Date: 09/02/2019    ECHOCARDIOGRAM REPORT   Patient Name:   KAELA BEITZ Date of Exam: 09/02/2019 Medical Rec #:  [id number]        Height:       66.0 in Accession #:    [id number]       Weight:       255.0 lb Date of Birth:  08-Aug-2000        BSA:          2.217 m Patient Age:    19 years         BP:           104/53 mmHg Patient Gender: F                HR:           64 bpm. Exam Location:  Inpatient Procedure: 2D Echo, Color Doppler and Cardiac Doppler Indications:    I26.02 Pulmonary  embolus  History:        Patient has no prior history of Echocardiogram examinations.                 Risk Factors:COVID+  08/19/19.  Sonographer:    Irving BurtonEmily Senior RDCS Referring Phys: [phone] JARED M GARDNER  Sonographer Comments: Technically difficult due to patient body habitus and COVID pneumonia at time of study. IMPRESSIONS  1. Left ventricular ejection fraction, by estimation, is 60 to 65%. The left ventricle has normal function. The left ventricle has no regional wall motion abnormalities. Left ventricular diastolic parameters were normal.  2. RV poorly visualized. Right ventricular systolic function was not well visualized. The right ventricular size is normal.  3. The mitral valve is normal in structure. No evidence of mitral valve regurgitation.  4. The aortic valve was not well visualized. Aortic valve regurgitation is not visualized.  5. The inferior vena cava is normal in size with greater than 50% respiratory variability, suggesting right atrial pressure of 3 mmHg. Conclusion(s)/Recommendation(s): Poorly visualized RV with grossly normal function, Otherwise normal echocardiogram, with minor abnormalities described in the report. No evidence of right heart strain. FINDINGS  Left Ventricle: Left ventricular ejection fraction, by estimation, is 60 to 65%. The left ventricle has normal function. The left ventricle has no regional wall motion abnormalities. The left ventricular internal cavity size was normal in size. There is  no left ventricular hypertrophy. Left ventricular diastolic parameters were normal. Right Ventricle: RV poorly visualized. The right ventricular size is normal. Right vetricular wall thickness was not assessed. Right ventricular systolic function was not well visualized. Left Atrium: Left atrial size was normal in size. Right Atrium: Right atrial size was normal in size. Pericardium: There is no evidence of pericardial effusion. Mitral Valve: The mitral valve is normal in structure. No  evidence of mitral valve regurgitation. Tricuspid Valve: The tricuspid valve is not well visualized. Tricuspid valve regurgitation is not demonstrated. Aortic Valve: The aortic valve was not well visualized. Aortic valve regurgitation is not visualized. Pulmonic Valve: The pulmonic valve was not well visualized. Pulmonic valve regurgitation is not visualized. Aorta: The aortic root and ascending aorta are structurally normal, with no evidence of dilitation. Venous: The inferior vena cava is normal in size with greater than 50% respiratory variability, suggesting right atrial pressure of 3 mmHg. IAS/Shunts: No atrial level shunt detected by color flow Doppler.  LEFT VENTRICLE PLAX 2D LVIDd:         4.60 cm  Diastology LVIDs:         3.00 cm  LV e' lateral:   10.80 cm/s LV PW:         0.90 cm  LV E/e' lateral: 6.0 LV IVS:        0.80 cm  LV e' medial:    12.60 cm/s LVOT diam:     2.00 cm  LV E/e' medial:  5.1 LV SV:         68 LV SV Index:   31 LVOT Area:     3.14 cm  RIGHT VENTRICLE RV S prime:     7.83 cm/s TAPSE (M-mode): 2.1 cm LEFT ATRIUM             Index       RIGHT ATRIUM           Index LA diam:        4.20 cm 1.89 cm/m  RA Area:     16.80 cm LA Vol (A2C):   32.0 ml 14.43 ml/m RA Volume:   44.50 ml  20.07 ml/m LA Vol (A4C):   72.3 ml 32.61 ml/m LA Biplane Vol: 49.8 ml 22.46 ml/m  AORTIC VALVE LVOT Vmax:  88.10 cm/s LVOT Vmean:  68.800 cm/s LVOT VTI:    0.217 m  AORTA Ao Root diam: 2.90 cm MITRAL VALVE MV Area (PHT): 2.48 cm    SHUNTS MV Decel Time: 306 msec    Systemic VTI:  0.22 m MV E velocity: 64.70 cm/s  Systemic Diam: 2.00 cm MV A velocity: 45.40 cm/s MV E/A ratio:  1.43 Zoila Shutter MD Electronically signed by Zoila Shutter MD Signature Date/Time: 09/02/2019/12:54:28 PM    Final    VAS [place] LOWER EXTREMITY VENOUS (DVT)  Result Date: 09/02/2019  Lower Venous DVTStudy Indications: Pulmonary embolism.  Risk Factors: COVID 19 positive. Limitations: Body habitus and poor ultrasound/tissue  interface. Comparison Study: 08/25/2019 - Negative for DVT. Performing Technologist: Chanda Busing RVT  Examination Guidelines: A complete evaluation includes B-mode imaging, spectral Doppler, color Doppler, and power Doppler as needed of all accessible portions of each vessel. Bilateral testing is considered an integral part of a complete examination. Limited examinations for reoccurring indications may be performed as noted. The reflux portion of the exam is performed with the patient in reverse Trendelenburg.  +---------+---------------+---------+-----------+----------+--------------+ RIGHT    CompressibilityPhasicitySpontaneityPropertiesThrombus Aging +---------+---------------+---------+-----------+----------+--------------+ CFV      Full           Yes      Yes                                 +---------+---------------+---------+-----------+----------+--------------+ SFJ      Full                                                        +---------+---------------+---------+-----------+----------+--------------+ FV Prox  Full                                                        +---------+---------------+---------+-----------+----------+--------------+ FV Mid   Full                                                        +---------+---------------+---------+-----------+----------+--------------+ FV DistalFull                                                        +---------+---------------+---------+-----------+----------+--------------+ PFV      Full                                                        +---------+---------------+---------+-----------+----------+--------------+ POP      Full           Yes      Yes                                 +---------+---------------+---------+-----------+----------+--------------+  PTV      Full                                                         +---------+---------------+---------+-----------+----------+--------------+ PERO     Full                                                        +---------+---------------+---------+-----------+----------+--------------+   +---------+---------------+---------+-----------+----------+--------------+ LEFT     CompressibilityPhasicitySpontaneityPropertiesThrombus Aging +---------+---------------+---------+-----------+----------+--------------+ CFV      Full           Yes      Yes                                 +---------+---------------+---------+-----------+----------+--------------+ SFJ      Full                                                        +---------+---------------+---------+-----------+----------+--------------+ FV Prox  Full                                                        +---------+---------------+---------+-----------+----------+--------------+ FV Mid   Full                                                        +---------+---------------+---------+-----------+----------+--------------+ FV DistalFull                                                        +---------+---------------+---------+-----------+----------+--------------+ PFV      Full                                                        +---------+---------------+---------+-----------+----------+--------------+ POP      Full           Yes      Yes                                 +---------+---------------+---------+-----------+----------+--------------+ PTV      None  Acute          +---------+---------------+---------+-----------+----------+--------------+ PERO     Full                                                        +---------+---------------+---------+-----------+----------+--------------+     Summary: RIGHT: - There is no evidence of deep vein thrombosis in the lower extremity.  - No cystic structure found in  the popliteal fossa.  LEFT: - Findings consistent with acute deep vein thrombosis involving the left posterior tibial veins. - No cystic structure found in the popliteal fossa.  *See table(s) above for measurements and observations. Electronically signed by Fabienne Bruns MD on 09/02/2019 at 5:09:22 PM.    Final        LOS: 1 day   Osvaldo Shipper  [facility] Pager on [email or web address]  09/03/2019, 12:06 PM

## 2019-09-03 NOTE — ED Notes (Signed)
Pt c/o cont worsening pain, states morphine did not relieve pain.

## 2019-09-03 NOTE — Progress Notes (Signed)
ANTICOAGULATION CONSULT NOTE  Pharmacy Consult for Heparin Indication: pulmonary embolus  Allergies  Allergen Reactions  . Hydrocortisone-Emollient Hives    Patient Measurements:   Heparin Dosing Weight: 86kg  Vital Signs: Temp: 98.1 F (36.7 C) (08/24 0021) Temp Source: Oral (08/24 0021) BP: 105/62 (08/24 0419) Pulse Rate: 75 (08/24 0419)  Labs: Recent Labs    09/01/19 2140 09/01/19 2140 09/01/19 2340 09/02/19 0601 09/02/19 0601 09/02/19 1241 09/02/19 2018 09/03/19 0415  HGB 10.4*   < >  --  9.9*  --   --   --  9.4*  HCT 31.8*  --   --  30.3*  --   --   --  29.9*  PLT 300  --   --  308  --   --   --  370  HEPARINUNFRC  --   --   --  0.39   < > 0.12* 0.60 0.49  CREATININE 0.75  --   --  0.86  --   --   --  1.00  TROPONINIHS  --   --  3  --   --   --   --   --    < > = values in this interval not displayed.    Estimated Creatinine Clearance: 116 mL/min (by C-G formula based on SCr of 1 mg/dL).   Medical History: Past Medical History:  Diagnosis Date  . Asthma   . Eczema   . Obesity     Medications:  Infusions:  . sodium chloride    . famotidine (PEPCID) IV    . heparin 2,000 Units/hr (09/03/19 0153)   Assessment: 19 yo F recently diagnosed with COVID PNA presents with increasing shortness of breath and hemoptysis.  Chest CT + PE. Not on blood thinners PTA Baseline CBC- Hg 10.4, pltc 300  09/03/2019:  HL this AM is 0.49, therapeutic  Hgb 9.4 low but stable  Plt 60  Scr 1.00 mg/dl  No line or bleeding issues per RN   Goal of Therapy:  Heparin level 0.3-0.7 units/ml Monitor platelets by anticoagulation protocol: Yes   Plan:   Continue current IV heparin rate of 2000 units/hr  Daily heparin level & CBC while on heparin  F/U long-term anticoagulation plans   Adalberto Cole, PharmD, BCPS 09/03/2019 5:29 AM

## 2019-09-03 NOTE — TOC Initial Note (Addendum)
Transition of Care [facility]) - Initial/Assessment Note    Patient Details  Name: [name] MRN: [id number] Date of Birth: Jan 08, 2001  Transition of Care [facility]) CM/SW Contact:    Elliot Cousin, RN Phone Number: [phone] 09/03/2019, 1:18 PM  Clinical Narrative:                 TOC CM spoke to pt and states she lives at home with her fiance, [name]. States she does have oxygen at home. She is currently without PCP or insurance. She is familiar with mychart. TOC CM arranged virtual appt on 09/26/2019 at 1030 am with PCP. Explained she would receive her medications to her bed prior to dc from the [facility] outpatient meds to bed program. Will Xarelto 30 day free trial card for 1st 30 days and MATCH for other dc meds. And her refills will be at the [facility], the clinic will assist her with getting approved through the drug company to receive Xarelto for free.   Expected Discharge Plan: Home/Self Care Barriers to Discharge: Continued Medical Work up   Patient Goals and CMS Choice Patient states their goals for this hospitalization and ongoing recovery are:: return home CMS Medicare.gov Compare Post Acute Care list provided to:: Patient Choice offered to / list presented to : Patient  Expected Discharge Plan and Services Expected Discharge Plan: Home/Self Care In-house Referral: Clinical Social Work Discharge Planning Services: CM Consult, Follow-up appt scheduled, Medication Assistance   Living arrangements for the past 2 months: Apartment Expected Discharge Date:  (unknown)                                    Prior Living Arrangements/Services Living arrangements for the past 2 months: Apartment Lives with:: Other (Comment) (fiance) Patient language and need for interpreter reviewed:: Yes Do you feel safe going back to the place where you live?: Yes      Need for Family Participation in Patient Care: No (Comment) Care giver support system in place?: No (comment) Current home  services: DME (oxygen ([employer])) Criminal Activity/Legal Involvement Pertinent to Current Situation/Hospitalization: No - Comment as needed  Activities of Daily Living Home Assistive Devices/Equipment: Oxygen ADL Screening (condition at time of admission) Patient's cognitive ability adequate to safely complete daily activities?: Yes Is the patient deaf or have difficulty hearing?: No Does the patient have difficulty seeing, even when wearing glasses/contacts?: No Does the patient have difficulty concentrating, remembering, or making decisions?: No Patient able to express need for assistance with ADLs?: Yes Does the patient have difficulty dressing or bathing?: No Independently performs ADLs?: Yes (appropriate for developmental age) Does the patient have difficulty walking or climbing stairs?: Yes (secondary to shortness of breath and left sided pain) Weakness of Legs: None Weakness of Arms/Hands: None  Permission Sought/Granted Permission sought to share information with : Case Manager, PCP, Magazine features editor, Family Supports Permission granted to share information with : Yes, Verbal Permission Granted  Share Information with NAME: Malachi Carl, Charmaine Kimpel  Permission granted to share info w AGENCY: [facility]  Permission granted to share info w Relationship: fiance, mother  Permission granted to share info w Contact Information: [phone]  Emotional Assessment       Orientation: : Oriented to Self, Oriented to Place, Oriented to  Time, Oriented to Situation   Psych Involvement: No (comment)  Admission diagnosis:  Acute pulmonary embolism (HCC) [I26.99] Patient Active Problem  List   Diagnosis Date Noted  . Acute pulmonary embolism (HCC) 09/01/2019  . Acute respiratory failure with hypoxia (HCC) 08/24/2019  . Acute respiratory disease due to COVID-19 virus 08/24/2019  . Obesity, Class III, BMI 40-49.9 (morbid obesity) (HCC) 04/25/2016  . Allergic  conjunctivitis of both eyes 04/25/2016  . Onychomycosis 04/25/2016  . Chronic pain of right knee 04/25/2016  . Eczema 04/25/2016  . Mild intermittent asthma, uncomplicated 04/25/2016   PCP:  Patient, No Pcp Per Pharmacy:   [facility] - [place], [place] - [address] [place] [address] Phone: [phone] Fax: [phone]  [facility] - [place], [place] - [address] [place] [address] Phone: [phone] Fax: [phone]     Social Determinants of Health (SDOH) Interventions    Readmission Risk Interventions No flowsheet data found.

## 2019-09-03 NOTE — ED Notes (Signed)
Pt sleeping in stretcher, VSS, wakes easily to verbal stimuli, NAD. Offers no complaints at this time.

## 2019-09-03 NOTE — ED Notes (Signed)
PT bedside clean up at this time. Urine remove from room

## 2019-09-03 NOTE — ED Notes (Signed)
Assumed care of pt at this time. Pt resting in stretcher, no s/sx of acute distress at this time.  

## 2019-09-04 ENCOUNTER — Inpatient Hospital Stay (HOSPITAL_COMMUNITY): Payer: Self-pay

## 2019-09-04 LAB — BASIC METABOLIC PANEL
Anion gap: 11 (ref 5–15)
BUN: 17 mg/dL (ref 6–20)
CO2: 25 mmol/L (ref 22–32)
Calcium: 8.2 mg/dL — ABNORMAL LOW (ref 8.9–10.3)
Chloride: 101 mmol/L (ref 98–111)
Creatinine, Ser: 0.93 mg/dL (ref 0.44–1.00)
GFR calc Af Amer: >60 mL/min (ref >60)
GFR calc non Af Amer: >60 mL/min (ref >60)
Glucose, Bld: 92 mg/dL (ref 70–99)
Potassium: 3.8 mmol/L (ref 3.5–5.1)
Sodium: 137 mmol/L (ref 135–145)

## 2019-09-04 LAB — CBC
HCT: 30.4 % — ABNORMAL LOW (ref 36.0–46.0)
Hemoglobin: 9.6 g/dL — ABNORMAL LOW (ref 12.0–15.0)
MCH: 28.5 pg (ref 26.0–34.0)
MCHC: 31.6 g/dL (ref 30.0–36.0)
MCV: 90.2 fL (ref 80.0–100.0)
Platelets: 354 10*3/uL (ref 150–400)
RBC: 3.37 MIL/uL — ABNORMAL LOW (ref 3.87–5.11)
RDW: 13.2 % (ref 11.5–15.5)
WBC: 11.8 10*3/uL — ABNORMAL HIGH (ref 4.0–10.5)
nRBC: 0 % (ref 0.0–0.2)

## 2019-09-04 LAB — HEPARIN LEVEL (UNFRACTIONATED)
Heparin Unfractionated: 0.91 IU/mL — ABNORMAL HIGH (ref 0.30–0.70)
Heparin Unfractionated: 0.56 IU/mL (ref 0.30–0.70)
Heparin Unfractionated: 0.49 IU/mL (ref 0.30–0.70)

## 2019-09-04 MED ORDER — HEPARIN (PORCINE) 25000 UT/250ML-% IV SOLN
1800.0000 [IU]/h | INTRAVENOUS | Status: DC
  Administered 2019-09-04 (×2): 1800 [IU]/h via INTRAVENOUS
  Filled 2019-09-04 (×3): qty 250

## 2019-09-04 MED ORDER — SENNA 8.6 MG PO TABS
1.0000 | ORAL_TABLET | Freq: Every day | ORAL | Status: DC
  Administered 2019-09-04 – 2019-09-06 (×5): 8.6 mg via ORAL
  Filled 2019-09-04 (×5): qty 1

## 2019-09-04 MED ORDER — POLYETHYLENE GLYCOL 3350 17 G PO PACK
17.0000 g | PACK | Freq: Every day | ORAL | Status: DC
  Administered 2019-09-04 – 2019-09-07 (×3): 17 g via ORAL
  Filled 2019-09-04 (×5): qty 1

## 2019-09-04 NOTE — Progress Notes (Signed)
ANTICOAGULATION CONSULT NOTE  Pharmacy Consult for Heparin Indication: pulmonary embolus  Allergies  Allergen Reactions  . Hydrocortisone-Emollient Hives    Patient Measurements: Height: 5\' 6"  (167.6 cm) Weight: 126.7 kg (279 lb 5.2 oz) IBW/kg (Calculated) : 59.3 Heparin Dosing Weight: 86kg  Vital Signs: Temp: 98.8 F (37.1 C) (08/25 1345) Temp Source: Oral (08/25 0612) BP: 113/71 (08/25 1345) Pulse Rate: 87 (08/25 1345)  Labs: Recent Labs    09/01/19 2140 09/01/19 2340 09/02/19 0601 09/02/19 1241 09/03/19 0415 09/03/19 0415 09/03/19 1302 09/04/19 0334 09/04/19 1134  HGB   < >  --  9.9*  --  9.4*   < > 9.4* 9.6*  --   HCT   < >  --  30.3*  --  29.9*  --  29.9* 30.4*  --   PLT   < >  --  308  --  370  --   --  354  --   HEPARINUNFRC  --   --  0.39   < > 0.49  --   --  0.91* 0.56  CREATININE   < >  --  0.86  --  1.00  --   --  0.93  --   TROPONINIHS  --  3  --   --   --   --   --   --   --    < > = values in this interval not displayed.    Estimated Creatinine Clearance: 131.5 mL/min (by C-G formula based on SCr of 0.93 mg/dL).   Medical History: Past Medical History:  Diagnosis Date  . Asthma   . Eczema   . Obesity     Medications:  Infusions:  . sodium chloride    . famotidine (PEPCID) IV    . heparin 1,800 Units/hr (09/04/19 09/06/19)   Assessment: 19 yo F recently diagnosed with COVID PNA presents with increasing shortness of breath and hemoptysis.  Chest CT + PE. Not on blood thinners PTA Baseline CBC- Hg 10.4, pltc 300  09/04/2019:  HL this AM is 0.56, supratherapeutic  Hgb 9.6 low but stable  Plt 354  Scr 0.93 mg/dl  No line or bleeding issues per RN   Goal of Therapy:  Heparin level 0.3-0.7 units/ml Monitor platelets by anticoagulation protocol: Yes   Plan:   Continue IV heparin rate at 1800 units/hr  Obtain HL 6 hours after rate change   Daily heparin level & CBC while on heparin  F/U long-term anticoagulation  plans   09/06/2019, PharmD, BCPS [phone] 09/04/2019 3:27 PM

## 2019-09-04 NOTE — Progress Notes (Signed)
PROGRESS NOTE  [name]  DOB: Oct 27, 2000  PCP: Patient, No Pcp Per [id number]  DOA: 09/01/2019  LOS: 2 days   Chief Complaint  Patient presents with  . Covid positive    8/9  . Shortness of Breath  . Cough   Brief narrative: [name] is a 19 y.o. female with PMH of morbid obesity, asthma, eczema.  Patient was recently hospitalized with Covid pneumonia (8/14-8/17).  She completed the course of prednisone on 8/22 and has remained on 3 L oxygen since discharge.  Patient however continued to have difficulty breathing along with pain on the left side of the chest as well as hemoptysis and hence presented to the ED again on 8/22. In the ED, CT angio of chest showed marked severity patchy bilateral multifocal infiltrates as well as a new mild pulmonary embolism involving multiple lower lobe branches of the bilateral pulmonary arteries.  Patient was started on heparin drip and admitted to hospitalist service for further evaluation management  Subjective: Patient was seen and examined this morning.  Young African-American female.  Propped up in bed.  Remains on 3 L oxygen by nasal cannula.  Remains on heparin drip. Patient is bit concerned this morning his abdominal pain and no bowel movement in last 2 weeks. Hemodynamically stable. Labs from this morning with WBC count improving, 11.8, hemoglobin stable at 9.6  Assessment/Plan: Acute pulmonary embolism Acute DVT in left lower extremity/hemoptysis -Multifactorial etiology of acute DVT/PE : Morbid obesity, recent Covid infection, decreased mobilization because of hypoxia.  Patient denies any previous diagnosis of PCOS. -Currently on heparin drip.  Patient has a history of menorrhagia and intermittent vaginal spotting on heparin drip.  Also has intermittent hemoptysis.  However hemoglobin remains stable so far.  If hemoglobin is stable tomorrow, will switch to NOAC with a plan to discharge home.   Recent Covid  pneumonia Acute hypoxic respiratory failure -Patient was recently hospitalized with Covid pneumonia (8/14-8/17).  She completed the course of prednisone on 8/22 and has remained on 3 L oxygen since discharge.   -CT angio of chest on admission showed severe multifocal infiltrates.  Patient has completed the course of steroids. -I would repeat ferritin and CRP with morning labs tomorrow.  If elevated, she may benefit from prolonged course of steroids. -Check ambulatory oxygen  Leukocytosis Most likely due to steroids.  She is afebrile.  Continue to monitor.   Menorrhagia Patient apparently started her menstrual cycle on 8/23.  Worsened with heparin drip.  Hemoglobin remains stable however.  If hemoglobin continues to remain stable, will switch to NOAC tomorrow.    Chronic normocytic anemia -Hemoglobin trend stable as below.   -Ferritin 260, iron 50, TIBC 227.  B12 384.   -Folate noted to be low at 4.3.  Started on supplementation.  Morbid obesity - Body mass index is 45.08 kg/m. Patient has been advised to make an attempt to improve diet and exercise patterns to aid in weight loss.  Constipation -Reports 2-week history of constipation -I will obtain an abdominal x-ray.  If no evidence of obstruction, can give enema and MiraLAX.  Mobility: Encourage ambulation Code Status:   Code Status: Full Code  Nutritional status: Body mass index is 45.08 kg/m.     Diet Order            Diet regular Room service appropriate? Yes; Fluid consistency: Thin  Diet effective now  DVT prophylaxis: Heparin drip  Antimicrobials:  None Fluid: None  Consultants: None Family Communication:  None at bedside  Status is: Inpatient  Remains inpatient appropriate because:IV treatments appropriate due to intensity of illness or inability to take PO   Dispo:  Patient From: Home  Planned Disposition: Home  Expected discharge date: 09/05/19  Medically stable for discharge:  No   Infusions:  . sodium chloride    . famotidine (PEPCID) IV    . heparin 1,800 Units/hr (09/04/19 0521)    Scheduled Meds: . ascorbic acid  500 mg Oral Daily  . benzonatate  200 mg Oral TID  . folic acid  1 mg Oral Daily  . polyethylene glycol  17 g Oral Daily  . senna  1 tablet Oral QHS  . zinc sulfate  220 mg Oral Daily    Antimicrobials: Anti-infectives (From admission, onward)   Start     Dose/Rate Route Frequency Ordered Stop   09/02/19 2145  remdesivir 100 mg in sodium chloride 0.9 % 100 mL IVPB        100 mg 200 mL/hr over 30 Minutes Intravenous  Once 09/02/19 2131 09/03/19 0147      PRN meds: sodium chloride, acetaminophen **OR** acetaminophen, albuterol, chlorpheniramine-HYDROcodone, diphenhydrAMINE, famotidine (PEPCID) IV, HYDROcodone-acetaminophen, HYDROmorphone (DILAUDID) injection, ondansetron **OR** ondansetron (ZOFRAN) IV   Objective: Vitals:   09/04/19 0612 09/04/19 1345  BP: 129/74 113/71  Pulse: 65 87  Resp: 18 16  Temp: 99.1 F (37.3 C) 98.8 F (37.1 C)  SpO2: 97% 98%    Intake/Output Summary (Last 24 hours) at 09/04/2019 1353 Last data filed at 09/04/2019 0521 Gross per 24 hour  Intake 1189.61 ml  Output --  Net 1189.61 ml   Filed Weights   09/03/19 1900  Weight: 126.7 kg   Weight change:  Body mass index is 45.08 kg/m.   Physical Exam: General exam: Appears calm and comfortable.  Morbidly obese.  Not in physical distress Skin: No rashes, lesions or ulcers. HEENT: Atraumatic, normocephalic, supple neck, no obvious bleeding Lungs: Diminished air entry in both bases CVS: Regular rate and rhythm, no murmur GI/Abd soft, distended from obesity, mild diffuse tenderness, bowel sound present CNS: Alert, awake, oriented x3 Psychiatry: Mood appropriate Extremities: No pedal edema, no calf tenderness  Data Review: I have personally reviewed the laboratory data and studies available.  Recent Labs  Lab 09/01/19 2140 09/02/19 0601  09/03/19 0415 09/03/19 1302 09/04/19 0334  WBC 18.7* 18.3* 14.4*  --  11.8*  NEUTROABS 16.9*  --   --   --   --   HGB 10.4* 9.9* 9.4* 9.4* 9.6*  HCT 31.8* 30.3* 29.9* 29.9* 30.4*  MCV 86.2 87.3 89.3  --  90.2  PLT 300 308 370  --  354   Recent Labs  Lab 09/01/19 2140 09/02/19 0601 09/03/19 0415 09/04/19 0334  NA 137 139 138 137  K 4.1 3.7 3.7 3.8  CL 101 101 101 101  CO2 24 26 24 25   GLUCOSE 132* 147* 92 92  BUN 9 9 16 17   CREATININE 0.75 0.86 1.00 0.93  CALCIUM 8.4* 8.7* 8.4* 8.2*   Lab Results  Component Value Date   HGBA1C 4.9 10/18/2016       Component Value Date/Time   TRIG 180 (H) 08/24/2019 1459    Signed, 12/18/2016, MD [facility] Pager: [phone] (Secure Chat preferred). 09/04/2019

## 2019-09-04 NOTE — Progress Notes (Signed)
Pharmacy Brief Note - Anticoagulation Evening Follow Up:  Pt on heparin infusion for PE.  Assessment:  Confirmatory HL = 0.49 remains therapeutic on heparin infusion of 1800 units/hr  Confirmed with RN that heparin infusing at correct rate with no issues. Pt noted to have some hemoptysis on admission, currently stable. Not worsening.   Goal: Heparin level 0.3-0.7  Plan:  Continue heparin infusion at current rate of 1800 units/hr  Check HL, CBC with AM labs tomorrow  Cindi Carbon, PharmD 09/04/19 7:59 PM

## 2019-09-04 NOTE — Progress Notes (Signed)
ANTICOAGULATION CONSULT NOTE  Pharmacy Consult for Heparin Indication: pulmonary embolus  Allergies  Allergen Reactions   Hydrocortisone-Emollient Hives    Patient Measurements: Height: 5\' 6"  (167.6 cm) Weight: 126.7 kg (279 lb 5.2 oz) IBW/kg (Calculated) : 59.3 Heparin Dosing Weight: 86kg  Vital Signs: Temp: 99.3 F (37.4 C) (08/25 0210) Temp Source: Oral (08/25 0210) BP: 130/79 (08/25 0210) Pulse Rate: 81 (08/25 0210)  Labs: Recent Labs    09/01/19 2140 09/01/19 2340 09/02/19 0601 09/02/19 0601 09/02/19 1241 09/02/19 2018 09/03/19 0415 09/03/19 0415 09/03/19 1302 09/04/19 0334  HGB   < >  --  9.9*   < >  --   --  9.4*   < > 9.4* 9.6*  HCT   < >  --  30.3*   < >  --   --  29.9*  --  29.9* 30.4*  PLT   < >  --  308  --   --   --  370  --   --  354  HEPARINUNFRC  --   --  0.39  --    < > 0.60 0.49  --   --  0.91*  CREATININE   < >  --  0.86  --   --   --  1.00  --   --  0.93  TROPONINIHS  --  3  --   --   --   --   --   --   --   --    < > = values in this interval not displayed.    Estimated Creatinine Clearance: 131.5 mL/min (by C-G formula based on SCr of 0.93 mg/dL).   Medical History: Past Medical History:  Diagnosis Date   Asthma    Eczema    Obesity     Medications:  Infusions:   sodium chloride     famotidine (PEPCID) IV     heparin 2,000 Units/hr (09/03/19 1616)   Assessment: 19 yo F recently diagnosed with COVID PNA presents with increasing shortness of breath and hemoptysis.  Chest CT + PE. Not on blood thinners PTA Baseline CBC- Hg 10.4, pltc 300  09/04/2019:  HL this AM is 0.91, supratherapeutic  Hgb 9.6 low but stable  Plt 354  Scr 0.93 mg/dl  No line or bleeding issues per RN   Goal of Therapy:  Heparin level 0.3-0.7 units/ml Monitor platelets by anticoagulation protocol: Yes   Plan:   Decrease IV heparin rate to 1800 units/hr  Obtain HL 6 hours after rate change   Daily heparin level & CBC while on  heparin  F/U long-term anticoagulation plans   09/06/2019, PharmD, BCPS 09/04/2019 4:56 AM

## 2019-09-05 LAB — CBC
HCT: 30.3 % — ABNORMAL LOW (ref 36.0–46.0)
Hemoglobin: 9.3 g/dL — ABNORMAL LOW (ref 12.0–15.0)
MCH: 27.7 pg (ref 26.0–34.0)
MCHC: 30.7 g/dL (ref 30.0–36.0)
MCV: 90.2 fL (ref 80.0–100.0)
Platelets: 328 10*3/uL (ref 150–400)
RBC: 3.36 MIL/uL — ABNORMAL LOW (ref 3.87–5.11)
RDW: 12.9 % (ref 11.5–15.5)
WBC: 9.6 10*3/uL (ref 4.0–10.5)
nRBC: 0 % (ref 0.0–0.2)

## 2019-09-05 LAB — BASIC METABOLIC PANEL
Anion gap: 9 (ref 5–15)
BUN: 12 mg/dL (ref 6–20)
CO2: 26 mmol/L (ref 22–32)
Calcium: 8.2 mg/dL — ABNORMAL LOW (ref 8.9–10.3)
Chloride: 102 mmol/L (ref 98–111)
Creatinine, Ser: 0.95 mg/dL (ref 0.44–1.00)
GFR calc Af Amer: >60 mL/min (ref >60)
GFR calc non Af Amer: >60 mL/min (ref >60)
Glucose, Bld: 90 mg/dL (ref 70–99)
Potassium: 3.5 mmol/L (ref 3.5–5.1)
Sodium: 137 mmol/L (ref 135–145)

## 2019-09-05 LAB — HEPARIN LEVEL (UNFRACTIONATED): Heparin Unfractionated: 0.69 IU/mL (ref 0.30–0.70)

## 2019-09-05 LAB — C-REACTIVE PROTEIN: CRP: 17 mg/dL — ABNORMAL HIGH (ref <1.0)

## 2019-09-05 LAB — FERRITIN: Ferritin: 155 ng/mL (ref 11–307)

## 2019-09-05 MED ORDER — APIXABAN 5 MG PO TABS: 5.0000 mg | ORAL_TABLET | Freq: Two times a day (BID) | ORAL | Status: DC

## 2019-09-05 MED ORDER — APIXABAN 5 MG PO TABS
10.0000 mg | ORAL_TABLET | ORAL | Status: AC
  Administered 2019-09-05: 10 mg via ORAL
  Filled 2019-09-05: qty 2

## 2019-09-05 MED ORDER — PREDNISOLONE 5 MG PO TABS
40.0000 mg | ORAL_TABLET | Freq: Two times a day (BID) | ORAL | Status: DC
  Administered 2019-09-05 – 2019-09-07 (×5): 40 mg via ORAL
  Filled 2019-09-05 (×5): qty 8

## 2019-09-05 MED ORDER — APIXABAN 5 MG PO TABS
10.0000 mg | ORAL_TABLET | Freq: Two times a day (BID) | ORAL | Status: DC
  Administered 2019-09-06 – 2019-09-07 (×3): 10 mg via ORAL
  Filled 2019-09-05 (×3): qty 2

## 2019-09-05 NOTE — Discharge Instructions (Addendum)
Information on my medicine - ELIQUIS (apixaban)  Why was Eliquis prescribed for you? Eliquis was prescribed to treat blood clots that may have been found in the veins of your legs (deep vein thrombosis) or in your lungs (pulmonary embolism) and to reduce the risk of them occurring again.  What do You need to know about Eliquis ? The starting dose is 10 mg (two 5 mg tablets) taken TWICE daily for the FIRST SEVEN (7) DAYS, then on (enter date)  09/13/2019  the dose is reduced to ONE 5 mg tablet taken TWICE daily.  Eliquis may be taken with or without food.   Try to take the dose about the same time in the morning and in the evening. If you have difficulty swallowing the tablet whole please discuss with your pharmacist how to take the medication safely.  Take Eliquis exactly as prescribed and DO NOT stop taking Eliquis without talking to the doctor who prescribed the medication.  Stopping may increase your risk of developing a new blood clot.  Refill your prescription before you run out.  After discharge, you should have regular check-up appointments with your healthcare provider that is prescribing your Eliquis.    What do you do if you miss a dose? If a dose of ELIQUIS is not taken at the scheduled time, take it as soon as possible on the same day and twice-daily administration should be resumed. The dose should not be doubled to make up for a missed dose.  Important Safety Information A possible side effect of Eliquis is bleeding. You should call your healthcare provider right away if you experience any of the following: ? Bleeding from an injury or your nose that does not stop. ? Unusual colored urine (red or dark brown) or unusual colored stools (red or black). ? Unusual bruising for unknown reasons. ? A serious fall or if you hit your head (even if there is no bleeding).  Some medicines may interact with Eliquis and might increase your risk of bleeding or clotting while on  Eliquis. To help avoid this, consult your healthcare provider or pharmacist prior to using any new prescription or non-prescription medications, including herbals, vitamins, non-steroidal anti-inflammatory drugs (NSAIDs) and supplements.  This website has more information on Eliquis (apixaban): http://www.eliquis.com/eliquis/home

## 2019-09-05 NOTE — TOC Progression Note (Signed)
Transition of Care [facility]) - Progression Note    Patient Details  Name: Briea Mcenery Ditmars MRN: [id number] Date of Birth: 06-May-2000  Transition [facility]) CM/SW Contact  Ida Rogue, [place] Phone Number: 09/05/2019, 3:02 PM  Clinical Narrative:   Spoke with Meredith Pel at [facility], who helped with the following plan.  Patient will d/c on Eliquis, among other medications.  We will get a 30 day initial supply from [facility] outpatient pharmacy, and then at patient's first appointment at [facility] on 9/16, she will be helped with application for on-going supply of Eliquis, along with other medication needs. TOC will continue to follow during the course of hospitalization.     Expected Discharge Plan: Home/Self Care Barriers to Discharge: Continued Medical Work up  Expected Discharge Plan and Services Expected Discharge Plan: Home/Self Care In-house Referral: Clinical Social Work Discharge Planning Services: CM Consult, Follow-up appt scheduled, Medication Assistance   Living arrangements for the past 2 months: Apartment Expected Discharge Date:  (unknown)                                     Social Determinants of Health (SDOH) Interventions    Readmission Risk Interventions No flowsheet data found.

## 2019-09-05 NOTE — Progress Notes (Signed)
ANTICOAGULATION CONSULT NOTE  Pharmacy Consult for Heparin Indication: pulmonary embolus  Allergies  Allergen Reactions  . Hydrocortisone-Emollient Hives    Patient Measurements: Height: 5\' 6"  (167.6 cm) Weight: 126.7 kg (279 lb 5.2 oz) IBW/kg (Calculated) : 59.3 Heparin Dosing Weight: 86kg  Vital Signs: Temp: 98.1 F (36.7 C) (08/26 1329) BP: 102/63 (08/26 1329) Pulse Rate: 68 (08/26 1329)  Labs: Recent Labs    09/03/19 0415 09/03/19 0415 09/03/19 1302 09/04/19 0334 09/04/19 0334 09/04/19 1134 09/04/19 1731 09/05/19 0341  HGB 9.4*   < > 9.4* 9.6*  --   --   --  9.3*  HCT 29.9*   < > 29.9* 30.4*  --   --   --  30.3*  PLT 370  --   --  354  --   --   --  328  HEPARINUNFRC 0.49   < >  --  0.91*   < > 0.56 0.49 0.69  CREATININE 1.00  --   --  0.93  --   --   --  0.95   < > = values in this interval not displayed.    Estimated Creatinine Clearance: 128.7 mL/min (by C-G formula based on SCr of 0.95 mg/dL).   Medical History: Past Medical History:  Diagnosis Date  . Asthma   . Eczema   . Obesity     Medications:  Infusions:  . sodium chloride    . famotidine (PEPCID) IV     Assessment: 19 yo F recently diagnosed with COVID PNA presents with increasing shortness of breath and hemoptysis.  Chest CT + PE. Not on blood thinners PTA Baseline CBC- Hg 10.4, pltc 300  09/05/2019 (PM)  Switching to Eliquis for discharge - pt set up with [facility] to continue therapy after discharge  Goal of Therapy:  Heparin level 0.3-0.7 units/ml Monitor platelets by anticoagulation protocol: Yes   Plan:   Start Eliquis 10 mg PO bid x 7 days, followed by 5 mg PO bid thereafter  Stop heparin with first dose of Eliquis  Pharmacy to provide Eliquis education prior to discharge  Pharmacy will sign off; following peripherally for dose adjustments or changes in clinical status  [facility], PharmD, BCPS [phone] 09/05/2019, 4:37 PM

## 2019-09-05 NOTE — Progress Notes (Signed)
PROGRESS NOTE  [name]  DOB: 2000-12-14  PCP: Patient, No Pcp Per OBS:[id number]  DOA: 09/01/2019  LOS: 3 days   Chief Complaint  Patient presents with  . Covid positive    8/9  . Shortness of Breath  . Cough   Brief narrative: [name] is a 19 y.o. female with PMH of morbid obesity, asthma, eczema.  Patient was Covid positive on 8/9 and recently hospitalized with Covid pneumonia (8/14-8/17).  She completed the course of prednisone on 8/22 and has remained on 3 L oxygen since discharge.  Patient however continued to have difficulty breathing along with pain on the left side of the chest as well as hemoptysis and hence presented to the ED again on 8/22. In the ED, CT angio of chest showed marked severity patchy bilateral multifocal infiltrates as well as a new mild pulmonary embolism involving multiple lower lobe branches of the bilateral pulmonary arteries.  Patient was started on heparin drip and admitted to hospitalist service for further evaluation management  Subjective: Patient was seen and examined this morning.  Young African-American female.   Lying on bed.  Not in distress.  Still remains on 3 L oxygen by nasal cannula.  On heparin drip.   Patient states this morning she walked to the bathroom without oxygen, felt dizzy and pale.  Also had hemoptysis.  She did not pass out.  She was helped back to bed by nursing staff.  Per RN, she had been tinged phlegm but the tech before RN had noticed a little more amount of blood.   Hemoglobin this morning is stable at 9.3. Her CRP level is still up at 17.  Assessment/Plan: Acute pulmonary embolism Acute DVT in left lower extremity/hemoptysis -Multifactorial etiology of acute DVT/PE : Morbid obesity, recent Covid infection, decreased mobilization because of hypoxia.  Patient denies any previous diagnosis of PCOS. -Currently on heparin drip.  Vaginal bleeding has stopped but patient had an episode of hemoptysis this  morning which sounds significant by her account.  We will switch her to Eliquis today but continue to monitor for next 24 hours.  If no further episode of bleeding or significant drop in hemoglobin tomorrow, plan to discharge on Eliquis tomorrow.  Recent Covid pneumonia Acute hypoxic respiratory failure -Patient was recently hospitalized with Covid pneumonia (8/14-8/17).  She completed the course of prednisone on 8/22 and has remained on 3 L oxygen since discharge.   -CT angio of chest on admission showed severe multifocal infiltrates.  Patient has completed the course of steroids. -Repeated ferritin and CRP this morning.  Ferritin is normal but CRP remains elevated at 17. -Based on elevated inflammatory markers, persistent infiltrates on chest x-ray and persistent oxygen dependence, I would restart her on a 10 to 14-day course of steroids again. -Continue supplemental oxygen.  Wean down as tolerated. Lab Results  Component Value Date   SARSCOV2NAA POSITIVE (A) 08/19/2019   SARSCOV2NAA NEGATIVE 04/24/2019    Recent Labs  Lab 09/01/19 2140 09/02/19 0601 09/03/19 0415 09/03/19 0417 09/04/19 0334 09/05/19 0341  WBC 18.7* 18.3* 14.4*  --  11.8* 9.6  FERRITIN  --   --   --  260  --  155  CRP  --   --   --   --   --  17.0*   Menorrhagia Patient apparently started her menstrual cycle on 8/23.  Bleeding initially worsened with heparin drip.  Bleeding has now stopped.  Chronic normocytic anemia -Hemoglobin trend stable as below.   -  Ferritin 260, iron 50, TIBC 227.  B12 384.   -Folate noted to be low at 4.3.    She has been started on supplementation.  Morbid obesity - Body mass index is 45.08 kg/m. Patient has been advised to make an attempt to improve diet and exercise patterns to aid in weight loss.  Constipation -Reports 2-week history of constipation. -No bowel obstruction on abdominal x-ray.  Had bowel movement with enema yesterday.  Continue daily Senokot and as needed  MiraLAX.  Mobility: Encourage ambulation Code Status:   Code Status: Full Code  Nutritional status: Body mass index is 45.08 kg/m.     Diet Order            Diet regular Room service appropriate? Yes; Fluid consistency: Thin  Diet effective now                 DVT prophylaxis: Heparin drip  Antimicrobials:  None Fluid: None  Consultants: None Family Communication:  None at bedside  Status is: Inpatient  Remains inpatient appropriate because of intermittent bleeding, persistent oxygen dependence.  Dispo:  Patient From: Home  Planned Disposition: Home  Expected discharge date: 09/06/2019  medically stable for discharge: No   Infusions:  . sodium chloride    . famotidine (PEPCID) IV      Scheduled Meds: . ascorbic acid  500 mg Oral Daily  . benzonatate  200 mg Oral TID  . folic acid  1 mg Oral Daily  . polyethylene glycol  17 g Oral Daily  . senna  1 tablet Oral QHS  . zinc sulfate  220 mg Oral Daily    Antimicrobials: Anti-infectives (From admission, onward)   Start     Dose/Rate Route Frequency Ordered Stop   09/02/19 2145  remdesivir 100 mg in sodium chloride 0.9 % 100 mL IVPB        100 mg 200 mL/hr over 30 Minutes Intravenous  Once 09/02/19 2131 09/03/19 0147      PRN meds: sodium chloride, acetaminophen **OR** acetaminophen, albuterol, chlorpheniramine-HYDROcodone, diphenhydrAMINE, famotidine (PEPCID) IV, HYDROcodone-acetaminophen, HYDROmorphone (DILAUDID) injection, ondansetron **OR** ondansetron (ZOFRAN) IV   Objective: Vitals:   09/05/19 0334 09/05/19 0844  BP: 111/72 131/83  Pulse: 64 91  Resp: 18 16  Temp: 97.9 F (36.6 C) 98.1 F (36.7 C)  SpO2: 100% 100%    Intake/Output Summary (Last 24 hours) at 09/05/2019 1156 Last data filed at 09/05/2019 0938 Gross per 24 hour  Intake 486 ml  Output --  Net 486 ml   Filed Weights   09/03/19 1900  Weight: 126.7 kg   Weight change:  Body mass index is 45.08 kg/m.   Physical  Exam: General exam: Appears calm and comfortable.  Morbidly obese.  Not in physical distress still remains on supplemental oxygen Skin: No rashes, lesions or ulcers. HEENT: Atraumatic, normocephalic, supple neck, no obvious bleeding Lungs: Diminished air entry in both bases CVS: Regular rate and rhythm, no murmur GI/Abd soft, distended from obesity, mild diffuse tenderness, bowel sound present CNS: Alert, awake, oriented x3 Psychiatry: Mood appropriate Extremities: No pedal edema, no calf tenderness  Data Review: I have personally reviewed the laboratory data and studies available.  Recent Labs  Lab 09/01/19 2140 09/01/19 2140 09/02/19 0601 09/03/19 0415 09/03/19 1302 09/04/19 0334 09/05/19 0341  WBC 18.7*  --  18.3* 14.4*  --  11.8* 9.6  NEUTROABS 16.9*  --   --   --   --   --   --   HGB  10.4*   < > 9.9* 9.4* 9.4* 9.6* 9.3*  HCT 31.8*   < > 30.3* 29.9* 29.9* 30.4* 30.3*  MCV 86.2  --  87.3 89.3  --  90.2 90.2  PLT 300  --  308 370  --  354 328   < > = values in this interval not displayed.   Recent Labs  Lab 09/01/19 2140 09/02/19 0601 09/03/19 0415 09/04/19 0334 09/05/19 0341  NA 137 139 138 137 137  K 4.1 3.7 3.7 3.8 3.5  CL 101 101 101 101 102  CO2 24 26 24 25 26   GLUCOSE 132* 147* 92 92 90  BUN 9 9 16 17 12   CREATININE 0.75 0.86 1.00 0.93 0.95  CALCIUM 8.4* 8.7* 8.4* 8.2* 8.2*   Lab Results  Component Value Date   HGBA1C 4.9 10/18/2016       Component Value Date/Time   TRIG 180 (H) 08/24/2019 1459    Signed, 12/18/2016, MD [facility] Pager: [phone] (Secure Chat preferred). 09/05/2019

## 2019-09-05 NOTE — Progress Notes (Signed)
ANTICOAGULATION CONSULT NOTE  Pharmacy Consult for Heparin Indication: pulmonary embolus  Allergies  Allergen Reactions  . Hydrocortisone-Emollient Hives    Patient Measurements: Height: 5\' 6"  (167.6 cm) Weight: 126.7 kg (279 lb 5.2 oz) IBW/kg (Calculated) : 59.3 Heparin Dosing Weight: 86kg  Vital Signs: Temp: 97.9 F (36.6 C) (08/26 0334) Temp Source: Oral (08/26 0334) BP: 111/72 (08/26 0334) Pulse Rate: 64 (08/26 0334)  Labs: Recent Labs    09/03/19 0415 09/03/19 0415 09/03/19 1302 09/04/19 0334 09/04/19 0334 09/04/19 1134 09/04/19 1731 09/05/19 0341  HGB 9.4*   < > 9.4* 9.6*  --   --   --  9.3*  HCT 29.9*   < > 29.9* 30.4*  --   --   --  30.3*  PLT 370  --   --  354  --   --   --  328  HEPARINUNFRC 0.49   < >  --  0.91*   < > 0.56 0.49 0.69  CREATININE 1.00  --   --  0.93  --   --   --  0.95   < > = values in this interval not displayed.    Estimated Creatinine Clearance: 128.7 mL/min (by C-G formula based on SCr of 0.95 mg/dL).   Medical History: Past Medical History:  Diagnosis Date  . Asthma   . Eczema   . Obesity     Medications:  Infusions:  . sodium chloride    . famotidine (PEPCID) IV    . heparin 1,800 Units/hr (09/04/19 2200)   Assessment: 19 yo F recently diagnosed with COVID PNA presents with increasing shortness of breath and hemoptysis.  Chest CT + PE. Not on blood thinners PTA Baseline CBC- Hg 10.4, pltc 300  09/05/2019:  HL this AM is 0.69 therapeutic at the upper end of goal range  Hgb 9.3 low but stable  Plt 328  Goal of Therapy:  Heparin level 0.3-0.7 units/ml Monitor platelets by anticoagulation protocol: Yes   Plan:   Continue IV heparin rate at 1800 units/hr though at higher end of goal range:  Likely switch to DOAC this AM  09/07/2019, PharmD, BCPS [phone] 09/05/2019 7:04 AM

## 2019-09-06 LAB — CBC
HCT: 30.9 % — ABNORMAL LOW (ref 36.0–46.0)
Hemoglobin: 9.7 g/dL — ABNORMAL LOW (ref 12.0–15.0)
MCH: 28 pg (ref 26.0–34.0)
MCHC: 31.4 g/dL (ref 30.0–36.0)
MCV: 89.3 fL (ref 80.0–100.0)
Platelets: 403 10*3/uL — ABNORMAL HIGH (ref 150–400)
RBC: 3.46 MIL/uL — ABNORMAL LOW (ref 3.87–5.11)
RDW: 12.7 % (ref 11.5–15.5)
WBC: 11.6 10*3/uL — ABNORMAL HIGH (ref 4.0–10.5)
nRBC: 0 % (ref 0.0–0.2)

## 2019-09-06 LAB — BASIC METABOLIC PANEL
Anion gap: 12 (ref 5–15)
BUN: 12 mg/dL (ref 6–20)
CO2: 26 mmol/L (ref 22–32)
Calcium: 9.1 mg/dL (ref 8.9–10.3)
Chloride: 103 mmol/L (ref 98–111)
Creatinine, Ser: 0.81 mg/dL (ref 0.44–1.00)
GFR calc Af Amer: >60 mL/min (ref >60)
GFR calc non Af Amer: >60 mL/min (ref >60)
Glucose, Bld: 162 mg/dL — ABNORMAL HIGH (ref 70–99)
Potassium: 4.6 mmol/L (ref 3.5–5.1)
Sodium: 141 mmol/L (ref 135–145)

## 2019-09-06 LAB — C-REACTIVE PROTEIN: CRP: 14.2 mg/dL — ABNORMAL HIGH (ref <1.0)

## 2019-09-06 MED ORDER — LIDOCAINE 5 % EX PTCH
1.0000 | MEDICATED_PATCH | CUTANEOUS | Status: DC
  Administered 2019-09-06: 1 via TRANSDERMAL
  Filled 2019-09-06 (×2): qty 1

## 2019-09-06 NOTE — Progress Notes (Signed)
Blood tinged sputum x2 Continues to complain of left sided pain.  Relief noted with pain meds

## 2019-09-06 NOTE — Progress Notes (Signed)
PROGRESS NOTE  [name]  DOB: Aug 31, 2000  PCP: Patient, No Pcp Per [id number]  DOA: 09/01/2019  LOS: 4 days   Chief Complaint  Patient presents with  . Covid positive    8/9  . Shortness of Breath  . Cough   Brief narrative: [name] is a 19 y.o. female with PMH of morbid obesity, asthma, eczema.  Patient was Covid positive on 8/9 and recently hospitalized with Covid pneumonia (8/14-8/17).  She completed the course of prednisone on 8/22 and has remained on 3 L oxygen since discharge.  Patient however continued to have difficulty breathing along with pain on the left side of the chest as well as hemoptysis and hence presented to the ED again on 8/22. In the ED, CT angio of chest showed marked severity patchy bilateral multifocal infiltrates as well as a new mild pulmonary embolism involving multiple lower lobe branches of the bilateral pulmonary arteries.  Patient was started on heparin drip and admitted to hospitalist service for further evaluation management  Subjective: Patient was seen and examined this morning.   Lying down in bed on supplemental oxygen.  Complains of left-sided chest pain, was worse last night Vomited one time this morning.  Assessment/Plan: Acute pulmonary embolism Acute DVT in left lower extremity/hemoptysis -Multifactorial etiology of acute DVT/PE : Morbid obesity, recent Covid infection, decreased mobilization because of hypoxia.  Patient denies any previous diagnosis of PCOS. -Initially placed on heparin drip.  Switched to oral Eliquis yesterday.   -Continue to monitor for recurrence of vaginal bleeding or GI bleeding.   -Lidocaine patch on left chest.  Recent Covid pneumonia Acute hypoxic respiratory failure -Patient was recently hospitalized with Covid pneumonia (8/14-8/17). She completed the course of prednisone on 8/22 and has remained on 3 L oxygen since discharge.   -CT angio of chest on admission showed severe multifocal  infiltrates. I restarted the patient on prednisone 60 mg twice daily on 8/26.   -CRP improving this morning. -Continue supplemental oxygen.  Wean down as tolerated. Lab Results  Component Value Date   SARSCOV2NAA POSITIVE (A) 08/19/2019   SARSCOV2NAA NEGATIVE 04/24/2019    Recent Labs  Lab 09/02/19 0601 09/03/19 0415 09/03/19 0417 09/04/19 0334 09/05/19 0341 09/06/19 0347  WBC 18.3* 14.4*  --  11.8* 9.6 11.6*  FERRITIN  --   --  260  --  155  --   CRP  --   --   --   --  17.0* 14.2*   Menorrhagia -Patient apparently started her menstrual cycle on 8/23.   -Bleeding not worsening on Eliquis.  Chronic normocytic anemia -Hemoglobin trend stable as below.   -Ferritin 260, iron 50, TIBC 227. B12 384.   -Folate noted to be low at 4.3. She has been started on supplementation.  Morbid obesity - Body mass index is 45.08 kg/m. Patient has been advised to make an attempt to improve diet and exercise patterns to aid in weight loss.  Constipation -Continue daily Senokot and as needed MiraLAX.  Mobility: Encourage ambulation Code Status:   Code Status: Full Code  Nutritional status: Body mass index is 45.08 kg/m.     Diet Order            Diet regular Room service appropriate? Yes; Fluid consistency: Thin  Diet effective now                 DVT prophylaxis: Heparin drip  Antimicrobials:  None Fluid: None  Consultants: None Family Communication:  None  at bedside  Status is: Inpatient  Remains inpatient appropriate because of intermittent bleeding, persistent oxygen dependence.  Dispo:  Patient From: Home  Planned Disposition: Home  Expected discharge date: Tentative plan to discharge home tomorrow  medically stable for discharge: No   Infusions:  . sodium chloride    . famotidine (PEPCID) IV      Scheduled Meds: . apixaban  10 mg Oral BID   Followed by  . [START ON 09/13/2019] apixaban  5 mg Oral BID  . ascorbic acid  500 mg Oral Daily  . benzonatate  200  mg Oral TID  . folic acid  1 mg Oral Daily  . lidocaine  1 patch Transdermal Q24H  . polyethylene glycol  17 g Oral Daily  . prednisoLONE  40 mg Oral BID  . senna  1 tablet Oral QHS  . zinc sulfate  220 mg Oral Daily    Antimicrobials: Anti-infectives (From admission, onward)   Start     Dose/Rate Route Frequency Ordered Stop   09/02/19 2145  remdesivir 100 mg in sodium chloride 0.9 % 100 mL IVPB        100 mg 200 mL/hr over 30 Minutes Intravenous  Once 09/02/19 2131 09/03/19 0147      PRN meds: sodium chloride, acetaminophen **OR** acetaminophen, albuterol, chlorpheniramine-HYDROcodone, diphenhydrAMINE, famotidine (PEPCID) IV, HYDROcodone-acetaminophen, ondansetron **OR** ondansetron (ZOFRAN) IV   Objective: Vitals:   09/05/19 2102 09/06/19 0357  BP: 134/79 135/76  Pulse: 99 61  Resp: (!) 22 20  Temp: 98 F (36.7 C) (!) 97.5 F (36.4 C)  SpO2: 92% 95%    Intake/Output Summary (Last 24 hours) at 09/06/2019 1416 Last data filed at 09/05/2019 1757 Gross per 24 hour  Intake 944 ml  Output --  Net 944 ml   Filed Weights   09/03/19 1900  Weight: 126.7 kg   Weight change:  Body mass index is 45.08 kg/m.   Physical Exam: General exam: Appears calm and comfortable.  Morbidly obese.  Complaining of chest pain on the left side this morning.   Skin: No rashes, lesions or ulcers. HEENT: Atraumatic, normocephalic, supple neck, no obvious bleeding Lungs: Diminished air entry in both bases CVS: Regular rate and rhythm, no murmur.  Left chest wall tenderness. GI/Abd soft, distended from obesity, mild diffuse tenderness, bowel sound present CNS: Alert, awake, oriented x3 Psychiatry: Mood appropriate Extremities: No pedal edema, no calf tenderness  Data Review: I have personally reviewed the laboratory data and studies available.  Recent Labs  Lab 09/01/19 2140 09/01/19 2140 09/02/19 0601 09/02/19 0601 09/03/19 0415 09/03/19 1302 09/04/19 0334 09/05/19 0341  09/06/19 0347  WBC 18.7*   < > 18.3*  --  14.4*  --  11.8* 9.6 11.6*  NEUTROABS 16.9*  --   --   --   --   --   --   --   --   HGB 10.4*   < > 9.9*   < > 9.4* 9.4* 9.6* 9.3* 9.7*  HCT 31.8*   < > 30.3*   < > 29.9* 29.9* 30.4* 30.3* 30.9*  MCV 86.2   < > 87.3  --  89.3  --  90.2 90.2 89.3  PLT 300   < > 308  --  370  --  354 328 403*   < > = values in this interval not displayed.   Recent Labs  Lab 09/02/19 0601 09/03/19 0415 09/04/19 0334 09/05/19 0341 09/06/19 0347  NA 139 138 137 137 141  K 3.7 3.7 3.8 3.5 4.6  CL 101 101 101 102 103  CO2 26 24 25 26 26   GLUCOSE 147* 92 92 90 162*  BUN 9 16 17 12 12   CREATININE 0.86 1.00 0.93 0.95 0.81  CALCIUM 8.7* 8.4* 8.2* 8.2* 9.1   Lab Results  Component Value Date   HGBA1C 4.9 10/18/2016       Component Value Date/Time   TRIG 180 (H) 08/24/2019 1459    Signed, 12/18/2016, MD [facility] Pager: [phone] (Secure Chat preferred). 09/06/2019

## 2019-09-07 LAB — BASIC METABOLIC PANEL
Anion gap: 10 (ref 5–15)
BUN: 15 mg/dL (ref 6–20)
CO2: 26 mmol/L (ref 22–32)
Calcium: 8.9 mg/dL (ref 8.9–10.3)
Chloride: 105 mmol/L (ref 98–111)
Creatinine, Ser: 0.78 mg/dL (ref 0.44–1.00)
GFR calc Af Amer: >60 mL/min (ref >60)
GFR calc non Af Amer: >60 mL/min (ref >60)
Glucose, Bld: 129 mg/dL — ABNORMAL HIGH (ref 70–99)
Potassium: 4 mmol/L (ref 3.5–5.1)
Sodium: 141 mmol/L (ref 135–145)

## 2019-09-07 LAB — C-REACTIVE PROTEIN: CRP: 7.5 mg/dL — ABNORMAL HIGH (ref <1.0)

## 2019-09-07 MED ORDER — PREDNISONE 10 MG PO TABS: ORAL_TABLET | ORAL | 0 refills | Status: DC

## 2019-09-07 MED ORDER — SENNA 8.6 MG PO TABS: 1.0000 | ORAL_TABLET | Freq: Every day | ORAL | 0 refills | Status: AC

## 2019-09-07 MED ORDER — LIDOCAINE 5 % EX PTCH: 1.0000 | MEDICATED_PATCH | CUTANEOUS | 0 refills | Status: AC

## 2019-09-07 MED ORDER — APIXABAN 5 MG PO TABS
ORAL_TABLET | ORAL | 0 refills | Status: DC
Start: 2019-09-07 — End: 2020-03-12

## 2019-09-07 MED ORDER — BENZONATATE 200 MG PO CAPS: 200.0000 mg | ORAL_CAPSULE | Freq: Three times a day (TID) | ORAL | 0 refills | Status: AC

## 2019-09-07 MED ORDER — FOLIC ACID 1 MG PO TABS: 1.0000 mg | ORAL_TABLET | Freq: Every day | ORAL | 0 refills | Status: AC

## 2019-09-07 MED FILL — BENZONATATE 200 MG CAP: 200 | 7 days supply | Qty: 21 | Fill #0

## 2019-09-07 MED FILL — predniSONE 10 MG TABS: 10 | 15 days supply | Qty: 60 | Fill #0

## 2019-09-07 MED FILL — FOLIC ACID 1 MG TABS: 1 | 30 days supply | Qty: 30 | Fill #0

## 2019-09-07 MED FILL — LIDOCAINE 5 % PTCH: 5 | 14 days supply | Qty: 14 | Fill #0

## 2019-09-07 MED FILL — ELIQUIS 5 MG TABLET: 5 | 23 days supply | Qty: 60 | Fill #0

## 2019-09-07 NOTE — TOC Progression Note (Signed)
Transition of Care [facility]) - Progression Note    Patient Details  Name: [name] MRN: [id number] Date of Birth: [birth year]  [facility] CM/SW Contact  Armanda Heritage, RN Phone Number: 09/07/2019, 1:23 PM  Clinical Narrative:    CM confirmed meds have been delivered to unit.  [facility] letter and Eliquis card faxed to pharmacy. Patient has a portable O2 tank to transport home.    Expected Discharge Plan: Home/Self Care Barriers to Discharge: No Barriers Identified  Expected Discharge Plan and Services Expected Discharge Plan: Home/Self Care In-house Referral: Clinical Social Work Discharge Planning Services: CM Consult, Follow-up appt scheduled, Medication Assistance   Living arrangements for the past 2 months: Apartment Expected Discharge Date: 09/07/19                                     Social Determinants of Health (SDOH) Interventions    Readmission Risk Interventions No flowsheet data found.

## 2019-09-07 NOTE — Progress Notes (Signed)
Pt going home this evening with her "fiance". Alert, oriented, and without c/o. Pt going home with home oxygen and meds provided from pharmacy. Pt aware to followup with PCP.

## 2019-09-07 NOTE — Discharge Summary (Signed)
Physician Discharge Summary  Jamiyah Dingley Noren [facility]:[id number] DOB: 03-30-2000 DOA: 09/01/2019  PCP: Patient, No Pcp Per  Admit date: 09/01/2019 Discharge date: 09/07/2019  Admitted From: home Discharge disposition: home    Code Status: Full Code  Diet Recommendation: regular diet  Discharge Diagnosis:   Principal Problem:   Acute pulmonary embolism (HCC) Active Problems:   Acute respiratory disease due to COVID-19 virus  History of Present Illness / Brief narrative:  [name] is a 19 y.o. female with PMH of morbid obesity, asthma, eczema.  Patient was Covid positive on 8/9 and recently hospitalized with Covid pneumonia (8/14-8/17).  She completed the course of prednisone on 8/22 and has remained on 3 L oxygen since discharge.  Patient however continued to have difficulty breathing along with pain on the left side of the chest as well as hemoptysis and hence presented to the ED again on 8/22. In the ED, CT angio of chest showed marked severity patchy bilateral multifocal infiltrates as well as a new mild pulmonary embolism involving multiple lower lobe branches of the bilateral pulmonary arteries.  Patient was started on heparin drip and admitted to hospitalist service for further evaluation management  Subjective:  Seen and examined this morning.  Feels better.  On 2 L oxygen by nasal cannula. No menorrhagia or hemoptysis or hematochezia today. Feels better enough to go home.  Hospital Course:  Acute pulmonary embolism Acute DVT inleft lower extremity/hemoptysis -Multifactorial etiology of acute DVT/PE : Morbid obesity, recent Covid infection, decreased mobilization because of hypoxia.  Patient denies any previous diagnosis of PCOS. -Initially placed on heparin drip.  Switched to oral Eliquis eventually.  Discharged home on the same.  Patient does not have insurance.  Starter pack initiated.  She has appointment at [facility] health wellness center in next few weeks.  She needs  [facility] to continue Eliquis for next 3 to 6 months. -No active vaginal or GI bleeding -Lidocaine patch on left chest for pleuritic pain due to pulmonary rhythm  Recent Covid pneumonia Acute hypoxic respiratory failure -Patient was recently hospitalized with Covid pneumonia (8/14-8/17). She completed the course of prednisone on 8/22 and has remained on 3 L oxygen since discharge.   -CT angio of chest on admission showed severe multifocal infiltrates.   So, I restarted the patient on prednisone 60 mg twice daily on 8/26.   -Clinically improving.  Inflammatory markers improving. -Continues to remain on supplemental oxygen.  Continue to use it at home.  Wean down as tolerated. -Discharged on oral prednisone slow tapering course. -Protonix while on steroids Lab Results  Component Value Date   SARSCOV2NAA POSITIVE (A) 08/19/2019   SARSCOV2NAA NEGATIVE 04/24/2019    Recent Labs  Lab 09/02/19 0601 09/03/19 0415 09/03/19 0417 09/04/19 0334 09/05/19 0341 09/06/19 0347 09/07/19 0432  WBC 18.3* 14.4*  --  11.8* 9.6 11.6*  --   FERRITIN  --   --  260  --  155  --   --   CRP  --   --   --   --  17.0* 14.2* 7.5*   Menorrhagia -Patient apparently startedher menstrual cycle on 8/23.   -Bleeding not getting worse on Eliquis  Chronic normocytic anemia -Hemoglobin trend stable as below.   -Ferritin 260, iron 50, TIBC 227. B12 384.  -Folate noted to be low at 4.3. She has been started on supplementation.  Morbid obesity - Body mass index is 45.08 kg/m. Patient has been advised to make an attempt to improve diet  and exercise patterns to aid in weight loss.  Constipation -Continue daily Senokot and as needed MiraLAX.  Mobility: Encourage ambulation Code Status:  Code Status: Full Code   Wound care:    Discharge Exam:   Vitals:   09/06/19 2025 09/07/19 0502 09/07/19 0750 09/07/19 0751  BP: 122/60 (!) 143/80 131/79 131/71  Pulse: 73 (!) 54 70 70  Resp: 18 18    Temp: 98.9  F (37.2 C) 97.8 F (36.6 C)  (!) 97.5 F (36.4 C)  TempSrc:    Oral  SpO2: 98% 100%  (!) 89%  Weight:      Height:        Body mass index is 45.08 kg/m.  General exam: Appears calm and comfortable.  Morbidly obese.  Not in physical distress today Skin: No rashes, lesions or ulcers. HEENT: Atraumatic, normocephalic, supple neck, no obvious bleeding Lungs: mild bibasilar Velcro-like crackles with Covid pneumonia.  CVS: Regular rate and rhythm, no murmur GI/Abd soft, nontender, distended from obesity, bowel sound present CNS: Alert, awake, oriented x3 Psychiatry: Mood appropriate Extremities: No pedal edema, no calf tenderness  Follow ups:   Discharge Instructions    Diet general   Complete by: As directed    Increase activity slowly   Complete by: As directed       Follow-up Information    [facility] Follow up.   Contact information: [address] [place] [address] [phone]              Recommendations for Outpatient Follow-Up:   1. Follow-up with PCP as an outpatient 2. Patient has an appointment at [facility] and wellness Center in next few weeks.  Discharge Instructions:  Follow with Primary MD Patient, No Pcp Per in 7 days   Get CBC/BMP checked in next visit within 1 week by PCP or SNF MD ( we routinely change or add medications that can affect your baseline labs and fluid status, therefore we recommend that you get the mentioned basic workup next visit with your PCP, your PCP may decide not to get them or add new tests based on their clinical decision)  On your next visit with your PCP, please Get Medicines reviewed and adjusted.  Please request your PCP  to go over all Hospital Tests and Procedure/Radiological results at the follow up, please get all Hospital records sent to your Prim MD by signing hospital release before you go home.  Activity: As tolerated with Full fall precautions use  walker/cane & assistance as needed  For Heart failure patients - Check your Weight same time everyday, if you gain over 2 pounds, or you develop in leg swelling, experience more shortness of breath or chest pain, call your Primary MD immediately. Follow Cardiac Low Salt Diet and 1.5 lit/day fluid restriction.  If you have smoked or chewed Tobacco in the last 2 yrs please stop smoking, stop any regular Alcohol  and or any Recreational drug use.  If you experience worsening of your admission symptoms, develop shortness of breath, life threatening emergency, suicidal or homicidal thoughts you must seek medical attention immediately by calling 911 or calling your MD immediately  if symptoms less severe.  You Must read complete instructions/literature along with all the possible adverse reactions/side effects for all the Medicines you take and that have been prescribed to you. Take any new Medicines after you have completely understood and accpet all the possible adverse reactions/side effects.   Do not  drive, operate heavy machinery, perform activities at heights, swimming or participation in water activities or provide baby sitting services if your were admitted for syncope or siezures until you have seen by Primary MD or a Neurologist and advised to do so again.  Do not drive when taking Pain medications.  Do not take more than prescribed Pain, Sleep and Anxiety Medications  Wear Seat belts while driving.   Please note You were cared for by a hospitalist during your hospital stay. If you have any questions about your discharge medications or the care you received while you were in the hospital after you are discharged, you can call the unit and asked to speak with the hospitalist on call if the hospitalist that took care of you is not available. Once you are discharged, your primary care physician will handle any further medical issues. Please note that NO REFILLS for any discharge medications will  be authorized once you are discharged, as it is imperative that you return to your primary care physician (or establish a relationship with a primary care physician if you do not have one) for your aftercare needs so that they can reassess your need for medications and monitor your lab values.    Allergies as of 09/07/2019      Reactions   Hydrocortisone-emollient Hives      Medication List    STOP taking these medications   ascorbic acid 500 MG tablet Commonly known as: VITAMIN C     TAKE these medications   albuterol 108 (90 Base) MCG/ACT inhaler Commonly known as: VENTOLIN HFA Inhale 2 puffs into the lungs every 6 (six) hours as needed for wheezing or shortness of breath.   apixaban 5 MG Tabs tablet Commonly known as: ELIQUIS Take 10 mg twice a day for 7 days followed by 5 mg twice a day for 3 to 6 months   benzonatate 200 MG capsule Commonly known as: TESSALON Take 1 capsule (200 mg total) by mouth 3 (three) times daily for 7 days.   folic acid 1 MG tablet Commonly known as: FOLVITE Take 1 tablet (1 mg total) by mouth daily. Start taking on: September 08, 2019   lidocaine 5 % Commonly known as: LIDODERM Place 1 patch onto the skin daily for 14 days. Remove & Discard patch within 12 hours or as directed by MD   predniSONE 10 MG tablet Commonly known as: DELTASONE 4 tabs twice daily X 3 days -->4 tabs daily X 3 days-->3 tabs daily X 3 days--> 2 tabs daily X 3 days--> 1 tab daily X 3 days, then STOP.   senna 8.6 MG Tabs tablet Commonly known as: SENOKOT Take 1 tablet (8.6 mg total) by mouth at bedtime.   zinc sulfate 220 (50 Zn) MG capsule Take 1 capsule (220 mg total) by mouth daily.       Time coordinating discharge: 35 minutes  The results of significant diagnostics from this hospitalization (including imaging, microbiology, ancillary and laboratory) are listed below for reference.    Procedures and Diagnostic Studies:   CT Angio Chest PE W and/or Wo  Contrast  Result Date: 09/01/2019 CLINICAL DATA:  COVID positive with fever and vomiting. EXAM: CT ANGIOGRAPHY CHEST WITH CONTRAST TECHNIQUE: Multidetector CT imaging of the chest was performed using the standard protocol during bolus administration of intravenous contrast. Multiplanar CT image reconstructions and MIPs were obtained to evaluate the vascular anatomy. CONTRAST:  OMNIPAQUE IOHEXOL 350 MG/ML SOLN COMPARISON:  August 25, 2019 FINDINGS: Cardiovascular:  A mild amount of intraluminal low attenuation is seen involving multiple lower lobe branches of the bilateral pulmonary arteries. Normal heart size. No pericardial effusion. Mediastinum/Nodes: No enlarged mediastinal, hilar, or axillary lymph nodes. Thyroid gland, trachea, and esophagus demonstrate no significant findings. Lungs/Pleura: Marked severity patchy multifocal infiltrates are seen scattered throughout both lungs. There is no evidence of a pleural effusion or pneumothorax. Upper Abdomen: No acute abnormality. Musculoskeletal: No chest wall abnormality. No acute or significant osseous findings. Review of the MIP images confirms the above findings. IMPRESSION: 1. Mild pulmonary embolism involving multiple lower lobe branches of the bilateral pulmonary arteries. 2. Marked severity patchy bilateral multifocal infiltrates. Electronically Signed   By: Aram Candelahaddeus  Houston M.D.   On: 09/01/2019 22:57   DG Chest Port 1 View  Result Date: 09/01/2019 CLINICAL DATA:  Shortness of breath, cough and COVID positive. EXAM: PORTABLE CHEST 1 VIEW COMPARISON:  09/01/2019 and prior radiographs FINDINGS: Diffuse bilateral airspace opacities are not significantly changed. Cardiomegaly again noted. No pleural effusion or pneumothorax. No acute bony abnormalities are present. IMPRESSION: Unchanged diffuse bilateral airspace opacities compatible with infection/pneumonia. Electronically Signed   By: Harmon PierJeffrey  Hu M.D.   On: 09/01/2019 18:46   ECHOCARDIOGRAM  COMPLETE  Result Date: 09/02/2019    ECHOCARDIOGRAM REPORT   Patient Name:   Kathie RhodesJAMAURI L Oetken Date of Exam: 09/02/2019 Medical Rec #:  [id number]        Height:       66.0 in Accession #:    [id number]       Weight:       255.0 lb Date of Birth:  2000/12/15        BSA:          2.217 m Patient Age:    19 years         BP:           104/53 mmHg Patient Gender: F                HR:           64 bpm. Exam Location:  Inpatient Procedure: 2D Echo, Color Doppler and Cardiac Doppler Indications:    I26.02 Pulmonary embolus  History:        Patient has no prior history of Echocardiogram examinations.                 Risk Factors:COVID+ 08/19/19.  Sonographer:    Irving BurtonEmily Senior RDCS Referring Phys: [phone] JARED M GARDNER  Sonographer Comments: Technically difficult due to patient body habitus and COVID pneumonia at time of study. IMPRESSIONS  1. Left ventricular ejection fraction, by estimation, is 60 to 65%. The left ventricle has normal function. The left ventricle has no regional wall motion abnormalities. Left ventricular diastolic parameters were normal.  2. RV poorly visualized. Right ventricular systolic function was not well visualized. The right ventricular size is normal.  3. The mitral valve is normal in structure. No evidence of mitral valve regurgitation.  4. The aortic valve was not well visualized. Aortic valve regurgitation is not visualized.  5. The inferior vena cava is normal in size with greater than 50% respiratory variability, suggesting right atrial pressure of 3 mmHg. Conclusion(s)/Recommendation(s): Poorly visualized RV with grossly normal function, Otherwise normal echocardiogram, with minor abnormalities described in the report. No evidence of right heart strain. FINDINGS  Left Ventricle: Left ventricular ejection fraction, by estimation, is 60 to 65%. The left ventricle has normal function. The left ventricle has no regional wall motion  abnormalities. The left ventricular internal cavity size was normal  in size. There is  no left ventricular hypertrophy. Left ventricular diastolic parameters were normal. Right Ventricle: RV poorly visualized. The right ventricular size is normal. Right vetricular wall thickness was not assessed. Right ventricular systolic function was not well visualized. Left Atrium: Left atrial size was normal in size. Right Atrium: Right atrial size was normal in size. Pericardium: There is no evidence of pericardial effusion. Mitral Valve: The mitral valve is normal in structure. No evidence of mitral valve regurgitation. Tricuspid Valve: The tricuspid valve is not well visualized. Tricuspid valve regurgitation is not demonstrated. Aortic Valve: The aortic valve was not well visualized. Aortic valve regurgitation is not visualized. Pulmonic Valve: The pulmonic valve was not well visualized. Pulmonic valve regurgitation is not visualized. Aorta: The aortic root and ascending aorta are structurally normal, with no evidence of dilitation. Venous: The inferior vena cava is normal in size with greater than 50% respiratory variability, suggesting right atrial pressure of 3 mmHg. IAS/Shunts: No atrial level shunt detected by color flow Doppler.  LEFT VENTRICLE PLAX 2D LVIDd:         4.60 cm  Diastology LVIDs:         3.00 cm  LV e' lateral:   10.80 cm/s LV PW:         0.90 cm  LV E/e' lateral: 6.0 LV IVS:        0.80 cm  LV e' medial:    12.60 cm/s LVOT diam:     2.00 cm  LV E/e' medial:  5.1 LV SV:         68 LV SV Index:   31 LVOT Area:     3.14 cm  RIGHT VENTRICLE RV S prime:     7.83 cm/s TAPSE (M-mode): 2.1 cm LEFT ATRIUM             Index       RIGHT ATRIUM           Index LA diam:        4.20 cm 1.89 cm/m  RA Area:     16.80 cm LA Vol (A2C):   32.0 ml 14.43 ml/m RA Volume:   44.50 ml  20.07 ml/m LA Vol (A4C):   72.3 ml 32.61 ml/m LA Biplane Vol: 49.8 ml 22.46 ml/m  AORTIC VALVE LVOT Vmax:   88.10 cm/s LVOT Vmean:  68.800 cm/s LVOT VTI:    0.217 m  AORTA Ao Root diam: 2.90 cm MITRAL VALVE  MV Area (PHT): 2.48 cm    SHUNTS MV Decel Time: 306 msec    Systemic VTI:  0.22 m MV E velocity: 64.70 cm/s  Systemic Diam: 2.00 cm MV A velocity: 45.40 cm/s MV E/A ratio:  1.43 Zoila Shutter MD Electronically signed by Zoila Shutter MD Signature Date/Time: 09/02/2019/12:54:28 PM    Final    VAS [place] LOWER EXTREMITY VENOUS (DVT)  Result Date: 09/02/2019  Lower Venous DVTStudy Indications: Pulmonary embolism.  Risk Factors: COVID 19 positive. Limitations: Body habitus and poor ultrasound/tissue interface. Comparison Study: 08/25/2019 - Negative for DVT. Performing Technologist: Chanda Busing RVT  Examination Guidelines: A complete evaluation includes B-mode imaging, spectral Doppler, color Doppler, and power Doppler as needed of all accessible portions of each vessel. Bilateral testing is considered an integral part of a complete examination. Limited examinations for reoccurring indications may be performed as noted. The reflux portion of the exam is performed with the patient in reverse Trendelenburg.  +---------+---------------+---------+-----------+----------+--------------+  RIGHT    CompressibilityPhasicitySpontaneityPropertiesThrombus Aging +---------+---------------+---------+-----------+----------+--------------+ CFV      Full           Yes      Yes                                 +---------+---------------+---------+-----------+----------+--------------+ SFJ      Full                                                        +---------+---------------+---------+-----------+----------+--------------+ FV Prox  Full                                                        +---------+---------------+---------+-----------+----------+--------------+ FV Mid   Full                                                        +---------+---------------+---------+-----------+----------+--------------+ FV DistalFull                                                         +---------+---------------+---------+-----------+----------+--------------+ PFV      Full                                                        +---------+---------------+---------+-----------+----------+--------------+ POP      Full           Yes      Yes                                 +---------+---------------+---------+-----------+----------+--------------+ PTV      Full                                                        +---------+---------------+---------+-----------+----------+--------------+ PERO     Full                                                        +---------+---------------+---------+-----------+----------+--------------+   +---------+---------------+---------+-----------+----------+--------------+ LEFT     CompressibilityPhasicitySpontaneityPropertiesThrombus Aging +---------+---------------+---------+-----------+----------+--------------+ CFV      Full           Yes      Yes                                 +---------+---------------+---------+-----------+----------+--------------+  SFJ      Full                                                        +---------+---------------+---------+-----------+----------+--------------+ FV Prox  Full                                                        +---------+---------------+---------+-----------+----------+--------------+ FV Mid   Full                                                        +---------+---------------+---------+-----------+----------+--------------+ FV DistalFull                                                        +---------+---------------+---------+-----------+----------+--------------+ PFV      Full                                                        +---------+---------------+---------+-----------+----------+--------------+ POP      Full           Yes      Yes                                  +---------+---------------+---------+-----------+----------+--------------+ PTV      None                                         Acute          +---------+---------------+---------+-----------+----------+--------------+ PERO     Full                                                        +---------+---------------+---------+-----------+----------+--------------+     Summary: RIGHT: - There is no evidence of deep vein thrombosis in the lower extremity.  - No cystic structure found in the popliteal fossa.  LEFT: - Findings consistent with acute deep vein thrombosis involving the left posterior tibial veins. - No cystic structure found in the popliteal fossa.  *See table(s) above for measurements and observations. Electronically signed by Fabienne Bruns MD on 09/02/2019 at 5:09:22 PM.    Final      Labs:   Basic Metabolic Panel: Recent Labs  Lab 09/03/19 0415 09/03/19 0415 09/04/19 4098 09/04/19 1191 09/05/19 0341 09/05/19 0341 09/06/19 0347 09/07/19 0432  NA 138  --  137  --  137  --  141 141  K 3.7   < > 3.8   < > 3.5   < > 4.6 4.0  CL 101  --  101  --  102  --  103 105  CO2 24  --  25  --  26  --  26 26  GLUCOSE 92  --  92  --  90  --  162* 129*  BUN 16  --  17  --  12  --  12 15  CREATININE 1.00  --  0.93  --  0.95  --  0.81 0.78  CALCIUM 8.4*  --  8.2*  --  8.2*  --  9.1 8.9   < > = values in this interval not displayed.   GFR Estimated Creatinine Clearance: 152.8 mL/min (by C-G formula based on SCr of 0.78 mg/dL). Liver Function Tests: No results for input(s): AST, ALT, ALKPHOS, BILITOT, PROT, ALBUMIN in the last 168 hours. No results for input(s): LIPASE, AMYLASE in the last 168 hours. No results for input(s): AMMONIA in the last 168 hours. Coagulation profile No results for input(s): INR, PROTIME in the last 168 hours.  CBC: Recent Labs  Lab 09/01/19 2140 09/01/19 2140 09/02/19 0601 09/02/19 0601 09/03/19 0415 09/03/19 1302 09/04/19 0334 09/05/19 0341  09/06/19 0347  WBC 18.7*   < > 18.3*  --  14.4*  --  11.8* 9.6 11.6*  NEUTROABS 16.9*  --   --   --   --   --   --   --   --   HGB 10.4*   < > 9.9*   < > 9.4* 9.4* 9.6* 9.3* 9.7*  HCT 31.8*   < > 30.3*   < > 29.9* 29.9* 30.4* 30.3* 30.9*  MCV 86.2   < > 87.3  --  89.3  --  90.2 90.2 89.3  PLT 300   < > 308  --  370  --  354 328 403*   < > = values in this interval not displayed.   Cardiac Enzymes: No results for input(s): CKTOTAL, CKMB, CKMBINDEX, TROPONINI in the last 168 hours. BNP: Invalid input(s): POCBNP CBG: No results for input(s): GLUCAP in the last 168 hours. D-Dimer No results for input(s): DDIMER in the last 72 hours. Hgb A1c No results for input(s): HGBA1C in the last 72 hours. Lipid Profile No results for input(s): CHOL, HDL, LDLCALC, TRIG, CHOLHDL, LDLDIRECT in the last 72 hours. Thyroid function studies No results for input(s): TSH, T4TOTAL, T3FREE, THYROIDAB in the last 72 hours.  Invalid input(s): FREET3 Anemia work up Recent Labs    09/05/19 0341  FERRITIN 155   Microbiology No results found for this or any previous visit (from the past 240 hour(s)).   Signed: Lorin Glass  [facility] 09/07/2019, 11:45 AM

## 2019-09-26 ENCOUNTER — Telehealth: Payer: Self-pay | Admitting: Physician Assistant

## 2020-01-23 ENCOUNTER — Other Ambulatory Visit: Payer: Self-pay

## 2020-01-23 ENCOUNTER — Emergency Department (HOSPITAL_COMMUNITY)
Admission: EM | Admit: 2020-01-23 | Discharge: 2020-01-23 | Disposition: A | Discharge status: Home / Self Care | Payer: 59 | Attending: Emergency Medicine | Admitting: Emergency Medicine

## 2020-01-23 ENCOUNTER — Encounter (HOSPITAL_COMMUNITY): Payer: Self-pay

## 2020-01-23 DIAGNOSIS — Z7722 Contact with and (suspected) exposure to environmental tobacco smoke (acute) (chronic): Secondary | ICD-10-CM | POA: Diagnosis not present

## 2020-01-23 DIAGNOSIS — Z7901 Long term (current) use of anticoagulants: Secondary | ICD-10-CM | POA: Insufficient documentation

## 2020-01-23 DIAGNOSIS — Z8616 Personal history of COVID-19: Secondary | ICD-10-CM | POA: Insufficient documentation

## 2020-01-23 DIAGNOSIS — M549 Dorsalgia, unspecified: Secondary | ICD-10-CM | POA: Diagnosis present

## 2020-01-23 DIAGNOSIS — M5440 Lumbago with sciatica, unspecified side: Secondary | ICD-10-CM | POA: Insufficient documentation

## 2020-01-23 DIAGNOSIS — J45909 Unspecified asthma, uncomplicated: Secondary | ICD-10-CM | POA: Insufficient documentation

## 2020-01-23 MED ORDER — IBUPROFEN 800 MG PO TABS: 800.0000 mg | ORAL_TABLET | Freq: Three times a day (TID) | ORAL | 0 refills | Status: DC | PRN

## 2020-01-23 MED ORDER — METHOCARBAMOL 500 MG PO TABS: 500.0000 mg | ORAL_TABLET | Freq: Four times a day (QID) | ORAL | 0 refills | Status: DC

## 2020-01-23 NOTE — Discharge Instructions (Signed)
Return if any problems.

## 2020-01-23 NOTE — ED Provider Notes (Signed)
[facility] EMERGENCY DEPARTMENT Provider Note   CSN: [id number] Arrival date & time: 01/23/20  1238     History Chief Complaint  Patient presents with  . Back Pain    [name] is a 20 y.o. female.  The history is provided by the patient. No language interpreter was used.  Back Pain Location:  Generalized Quality:  Aching Radiates to:  Does not radiate Pain severity:  Moderate Pain is:  Same all the time Chronicity:  New Relieved by:  Nothing Worsened by:  Nothing Ineffective treatments:  None tried Associated symptoms: no numbness        Past Medical History:  Diagnosis Date  . Asthma   . Eczema   . Obesity     Patient Active Problem List   Diagnosis Date Noted  . Acute pulmonary embolism (HCC) 09/01/2019  . Acute respiratory failure with hypoxia (HCC) 08/24/2019  . Acute respiratory disease due to COVID-19 virus 08/24/2019  . Obesity, Class III, BMI 40-49.9 (morbid obesity) (HCC) 04/25/2016  . Allergic conjunctivitis of both eyes 04/25/2016  . Onychomycosis 04/25/2016  . Chronic pain of right knee 04/25/2016  . Eczema 04/25/2016  . Mild intermittent asthma, uncomplicated 04/25/2016    Past Surgical History:  Procedure Laterality Date  . FOOT SURGERY     bilateral arch repair     OB History    Gravida  0   Para  0   Term  0   Preterm  0   AB  0   Living  0     SAB  0   IAB  0   Ectopic  0   Multiple  0   Live Births  0           Family History  Problem Relation Age of Onset  . Healthy Mother     Social History   Tobacco Use  . Smoking status: Passive Smoke Exposure - Never Smoker  . Smokeless tobacco: Never Used  Vaping Use  . Vaping Use: Every day  . Substances: Nicotine, Mixture of cannabinoids  Substance Use Topics  . Alcohol use: Yes    Comment: occ  . Drug use: Yes    Types: Marijuana    Home Medications Prior to Admission medications   Medication Sig Start Date End Date Taking? Authorizing  Provider  ibuprofen (ADVIL) 800 MG tablet Take 1 tablet (800 mg total) by mouth every 8 (eight) hours as needed. 01/23/20  Yes Cheron Schaumann K, PA-C  methocarbamol (ROBAXIN) 500 MG tablet Take 1 tablet (500 mg total) by mouth 4 (four) times daily. 01/23/20  Yes Cheron Schaumann K, PA-C  albuterol (VENTOLIN HFA) 108 (90 Base) MCG/ACT inhaler Inhale 2 puffs into the lungs every 6 (six) hours as needed for wheezing or shortness of breath.    [provider]  apixaban (ELIQUIS) 5 MG TABS tablet Take 10 mg twice a day for 7 days followed by 5 mg twice a day for 3 to 6 months 09/07/19   Dahal, Melina Schools, MD  predniSONE (DELTASONE) 10 MG tablet 4 tabs twice daily X 3 days -->4 tabs daily X 3 days-->3 tabs daily X 3 days--> 2 tabs daily X 3 days--> 1 tab daily X 3 days, then STOP. 09/07/19   Lorin Glass, MD  zinc sulfate 220 (50 Zn) MG capsule Take 1 capsule (220 mg total) by mouth daily. 08/28/19   Catarina Hartshorn, MD    Allergies    Hydrocortisone-emollient  Review of Systems  Review of Systems  Musculoskeletal: Positive for back pain.  Neurological: Negative for numbness.  All other systems reviewed and are negative.   Physical Exam Updated Vital Signs BP (!) 155/87 (BP Location: Right Arm)   Pulse 67   Temp 98.6 F (37 C) (Oral)   Resp 17   SpO2 99%   Physical Exam Vitals and nursing note reviewed.  Constitutional:      Appearance: She is well-developed and well-nourished.  HENT:     Head: Normocephalic.  Eyes:     Extraocular Movements: EOM normal.  Cardiovascular:     Rate and Rhythm: Normal rate and regular rhythm.  Pulmonary:     Effort: Pulmonary effort is normal.  Abdominal:     General: Abdomen is flat. There is no distension.  Musculoskeletal:        General: Normal range of motion.     Cervical back: Normal range of motion.     Comments: Diffusely tender ls spine,   Skin:    General: Skin is warm.  Neurological:     General: No focal deficit present.     Mental  Status: She is alert and oriented to person, place, and time.  Psychiatric:        Mood and Affect: Mood and affect and mood normal.     ED Results / Procedures / Treatments   Labs (all labs ordered are listed, but only abnormal results are displayed) Labs Reviewed - No data to display  EKG None  Radiology No results found.  Procedures Procedures (including critical care time)  Medications Ordered in ED Medications - No data to display  ED Course  I have reviewed the triage vital signs and the nursing notes.  Pertinent labs & imaging results that were available during my care of the patient were reviewed by me and considered in my medical decision making (see chart for details).    MDM Rules/Calculators/A&P                          MDM;  Pt given rx for ibuprofen and robaxin.  Pt advised to follow up with her primary MD for recheck in 1 week if pain persist  Final Clinical Impression(s) / ED Diagnoses Final diagnoses:  Low back pain with sciatica, sciatica laterality unspecified, unspecified back pain laterality, unspecified chronicity    Rx / DC Orders ED Discharge Orders         Ordered    methocarbamol (ROBAXIN) 500 MG tablet  4 times daily        01/23/20 1448    ibuprofen (ADVIL) 800 MG tablet  Every 8 hours PRN        01/23/20 1448        An After Visit Summary was printed and given to the patient.    Elson Areas, PA-C 01/23/20 1501    Bethann Berkshire, MD 01/24/20 [phone]
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
## 2020-01-23 NOTE — ED Notes (Signed)
Entered room and introduced self to patient. Pt appears to be resting in bed, respirations are even and unlabored with equal chest rise and fall. Bed is locked in the lowest position, side rails x2, call bell within reach. Pt educated on call light use and hourly rounding, verbalized understanding and in agreement at this time. All questions and concerns voiced addressed. Refreshments offered and provided per patient request.  

## 2020-03-12 ENCOUNTER — Other Ambulatory Visit: Payer: Self-pay

## 2020-03-12 ENCOUNTER — Encounter: Payer: Self-pay | Admitting: Women's Health

## 2020-03-12 ENCOUNTER — Other Ambulatory Visit (HOSPITAL_COMMUNITY)
Admission: RE | Admit: 2020-03-12 | Discharge: 2020-03-12 | Disposition: A | Discharge status: Home / Self Care | Payer: 59 | Source: Ambulatory Visit | Attending: Obstetrics & Gynecology | Admitting: Obstetrics & Gynecology

## 2020-03-12 ENCOUNTER — Ambulatory Visit (INDEPENDENT_AMBULATORY_CARE_PROVIDER_SITE_OTHER): Payer: 59 | Admitting: Women's Health

## 2020-03-12 VITALS — BP 134/89 | HR 85 | Ht 65.5 in | Wt 306.8 lb

## 2020-03-12 DIAGNOSIS — Z01419 Encounter for gynecological examination (general) (routine) without abnormal findings: Secondary | ICD-10-CM

## 2020-03-12 DIAGNOSIS — Z3202 Encounter for pregnancy test, result negative: Secondary | ICD-10-CM

## 2020-03-12 DIAGNOSIS — N926 Irregular menstruation, unspecified: Secondary | ICD-10-CM | POA: Diagnosis not present

## 2020-03-12 DIAGNOSIS — Z113 Encounter for screening for infections with a predominantly sexual mode of transmission: Secondary | ICD-10-CM

## 2020-03-12 LAB — POCT URINE PREGNANCY: Preg Test, Ur: NEGATIVE

## 2020-03-12 NOTE — Patient Instructions (Signed)
[facility]  . [employer], [address], [phone], [email or web address]

## 2020-03-12 NOTE — Progress Notes (Signed)
WELL-WOMAN EXAMINATION Patient name: [name] MRN [id number]  Date of birth: [birth year] Chief Complaint:   Gynecologic Exam  History of Present Illness:   [name] is a 20 y.o. G0P0000 African American female being seen today for a routine well-woman exam.  Current complaints: homosexual, wants to get pregnant, has a donor who plans to give her a semen sample and they plan to purchase a kit to inseminate. However, she is having very irregular periods. Had period in August that lasted a month, and one in Sept, then none since.  No excess facial hair, acne, hair loss or acanthosis nigricans. Not taking pnv.  H/O PE dx in Aug  Depression screen PHQ 2/9 03/12/2020  Decreased Interest 0  Down, Depressed, Hopeless 0  PHQ - 2 Score 0  Altered sleeping 0  Tired, decreased energy 0  Change in appetite 0  Feeling bad or failure about yourself  0  Trouble concentrating 0  Moving slowly or fidgety/restless 0  Suicidal thoughts 0  PHQ-9 Score 0     PCP: in [place]- sees her next week      Will get routine labs w/ PCP, but does want HIV/RPR Patient's last menstrual period was 09/28/2019 (exact date). The current method of family planning is none.  Last pap never. Results were: N/A. H/O abnormal pap: no Last mammogram: never. Results were: N/A. Family h/o breast cancer: yes, mom dx @ 30yo, pt's genetic testing neg Last colonoscopy: never. Results were: N/A. Family h/o colorectal cancer: no Review of Systems:   Pertinent items are noted in HPI Denies any headaches, blurred vision, fatigue, shortness of breath, chest pain, abdominal pain, abnormal vaginal discharge/itching/odor/irritation, problems with periods, bowel movements, urination, or intercourse unless otherwise stated above. Pertinent History Reviewed:  Reviewed past medical,surgical, social and family history.  Reviewed problem list, medications and allergies. Physical Assessment:   Vitals:   03/12/20 0845  BP: 134/89   Pulse: 85  Weight: (!) 306 lb 12.8 oz (139.2 kg)  Height: 5' 5.5" (1.664 m)  Body mass index is 50.28 kg/m.        Physical Examination by Jeanann Lewandowsky, SNP  General appearance - well appearing, and in no distress  Mental status - alert, oriented to person, place, and time  Psych:  She has a normal mood and affect  Skin - warm and dry, normal color, no suspicious lesions noted  Chest - effort normal, all lung fields clear to auscultation bilaterally  Heart - normal rate and regular rhythm  Neck:  midline trachea, no thyromegaly or nodules  Breasts - breasts appear normal, no suspicious masses, no skin or nipple changes or  axillary nodes  Abdomen - soft, nontender, nondistended, no masses or organomegaly  Pelvic - VULVA: normal appearing vulva with no masses, tenderness or lesions  VAGINA: normal appearing vagina with normal color and discharge, no lesions  CERVIX: normal appearing cervix without discharge or lesions, no CMT  Thin prep pap is done w/ reflex HR HPV cotesting  UTERUS: uterus is felt to be normal size, shape, consistency and nontender   ADNEXA: No adnexal masses or tenderness noted.  Extremities:  No swelling or varicosities noted  Chaperone: me    Results for orders placed or performed in visit on 03/12/20 (from the past 24 hour(s))  POCT urine pregnancy   Collection Time: 03/12/20  9:36 AM  Result Value Ref Range   Preg Test, Ur Negative Negative    Assessment & Plan:  1)  Well-Woman Exam  2) Secondary amenorrhea> no period since Sept, in homosexual relationship. No other sx of PCOS. UPT neg. Desires pregnancy, has a sperm donor and plans insemination. Recommended seeing fertility specialist, numbers given, make appt.  3) STD screen  Labs/procedures today: pap, labs  Mammogram: @ 21yo d/t family history, or sooner if problems Colonoscopy: @ 21yo, or sooner if problems  Orders Placed This Encounter  Procedures  . HIV Antibody (routine testing w rflx)  .  RPR  . POCT urine pregnancy    Meds: No orders of the defined types were placed in this encounter.   Follow-up: Return in about 1 year (around 03/12/2021) for Physical.  [place], WHNP-BC 03/12/2020 10:07 AM

## 2020-03-13 LAB — CYTOLOGY - PAP
Chlamydia: NEGATIVE
Comment: NEGATIVE
Comment: NORMAL
Diagnosis: NEGATIVE
Neisseria Gonorrhea: NEGATIVE

## 2020-03-18 ENCOUNTER — Telehealth: Payer: Self-pay

## 2020-03-18 NOTE — Telephone Encounter (Signed)
Pt called and stated that [facility] told her that she didn't need to be seen by their off, that Joellyn Haff could get her the medications that she needed for her fertility .

## 2020-03-18 NOTE — Telephone Encounter (Signed)
Returned pt's call for clarification. Pt stated that she called the fertility clinic and they told her that her insurance wouldn't cover the cost of her treatment. It would be $375 for each office visit. In order to be more cost effective for the pt, the clinic recommended that she get any medications to help get her ready for insemination through our office and providers. Informed pt that Joellyn Haff, CNM, wouldn't be in the office again until Monday, 3/14, but that a msg would be sent to her. Pt confirmed understanding.

## 2020-04-01 ENCOUNTER — Ambulatory Visit (INDEPENDENT_AMBULATORY_CARE_PROVIDER_SITE_OTHER): Payer: 59 | Admitting: Adult Health

## 2020-04-01 ENCOUNTER — Encounter: Payer: Self-pay | Admitting: Adult Health

## 2020-04-01 ENCOUNTER — Other Ambulatory Visit: Payer: Self-pay

## 2020-04-01 VITALS — BP 130/76 | HR 88 | Ht 65.5 in | Wt 307.4 lb

## 2020-04-01 DIAGNOSIS — Z319 Encounter for procreative management, unspecified: Secondary | ICD-10-CM

## 2020-04-01 DIAGNOSIS — N911 Secondary amenorrhea: Secondary | ICD-10-CM

## 2020-04-01 NOTE — Progress Notes (Signed)
  Subjective:     Patient ID: [name], female   DOB: 2000-07-28, 20 y.o.   MRN: [id number]  HPI Jizelle  Is a 20 year old black female,single with female DP, G0P0, in today take about getting pregnant. Her partners brother would donate the sperm for IVF she says, but she has not had a period since 09/28/19, and they have never been regular. She had a DVT and PE with COVID and has finished Eliquis. She had negative pap with negative GC/CHL 03/12/20.   Review of Systems  No period since 09/28/19,she has female partner  Periods have never been regular Started at about age 68 Reviewed past medical,surgical, social and family history. Reviewed medications and allergies.     Objective:   Physical Exam BP 130/76 (BP Location: Right Arm, Patient Position: Sitting, Cuff Size: Large)   Pulse 88   Ht 5' 5.5" (1.664 m)   Wt (!) 307 lb 6.4 oz (139.4 kg)   LMP 09/28/2019 (Exact Date)   BMI 50.38 kg/m  Skin warm and dry. Lungs: clear to ausculation bilaterally. Cardiovascular: regular rate and rhythm.     Upstream - 04/01/20 1421      Pregnancy Intention Screening   Does the patient want to become pregnant in the next year? Yes    Does the patient's partner want to become pregnant in the next year? Yes    Would the patient like to discuss contraceptive options today? No      Contraception Wrap Up   Current Method Pregnant/Seeking Pregnancy    End Method Pregnant/Seeking Pregnancy    Contraception Counseling Provided No          Assessment:     1. Secondary amenorrhea Discussed with her will check labs and [place] first  Check FSH,TSH,Prolactin and E2 Will get GYN [place] in about 3 weeks to assess ovaries  2. Patient desires pregnancy  Plan:     Will talk when results back

## 2020-04-02 LAB — FOLLICLE STIMULATING HORMONE: FSH: 6.7 m[IU]/mL

## 2020-04-02 LAB — ESTRADIOL: Estradiol: 64.6 pg/mL

## 2020-04-02 LAB — TSH: TSH: 0.71 u[IU]/mL (ref 0.450–4.500)

## 2020-04-02 LAB — PROLACTIN: Prolactin: 10.4 ng/mL (ref 4.8–23.3)

## 2020-04-07 ENCOUNTER — Other Ambulatory Visit: Payer: Self-pay

## 2020-04-07 ENCOUNTER — Ambulatory Visit (INDEPENDENT_AMBULATORY_CARE_PROVIDER_SITE_OTHER): Payer: 59 | Admitting: Podiatry

## 2020-04-07 ENCOUNTER — Ambulatory Visit (INDEPENDENT_AMBULATORY_CARE_PROVIDER_SITE_OTHER): Payer: 59

## 2020-04-07 DIAGNOSIS — M7742 Metatarsalgia, left foot: Secondary | ICD-10-CM

## 2020-04-07 DIAGNOSIS — M7751 Other enthesopathy of right foot: Secondary | ICD-10-CM

## 2020-04-07 DIAGNOSIS — M7741 Metatarsalgia, right foot: Secondary | ICD-10-CM

## 2020-04-07 DIAGNOSIS — M7752 Other enthesopathy of left foot: Secondary | ICD-10-CM

## 2020-04-07 DIAGNOSIS — M7750 Other enthesopathy of unspecified foot: Secondary | ICD-10-CM

## 2020-04-07 MED ORDER — MELOXICAM 15 MG PO TABS: 15.0000 mg | ORAL_TABLET | Freq: Every day | ORAL | 0 refills | Status: DC

## 2020-04-27 ENCOUNTER — Other Ambulatory Visit: Payer: 59

## 2020-04-27 ENCOUNTER — Ambulatory Visit: Payer: 59 | Admitting: Adult Health

## 2020-04-30 ENCOUNTER — Other Ambulatory Visit: Payer: Self-pay | Admitting: Pediatrics

## 2020-04-30 NOTE — Telephone Encounter (Signed)
I called the PT and she is actually being seen at another office. She says she will continue care there and get them to refill her prescriptions.

## 2020-05-05 ENCOUNTER — Other Ambulatory Visit: Payer: Self-pay

## 2020-05-05 ENCOUNTER — Ambulatory Visit (INDEPENDENT_AMBULATORY_CARE_PROVIDER_SITE_OTHER): Payer: 59 | Admitting: Podiatry

## 2020-05-05 DIAGNOSIS — M7751 Other enthesopathy of right foot: Secondary | ICD-10-CM | POA: Diagnosis not present

## 2020-05-05 DIAGNOSIS — M7750 Other enthesopathy of unspecified foot: Secondary | ICD-10-CM | POA: Diagnosis not present

## 2020-05-05 MED ORDER — MELOXICAM 15 MG PO TABS
15.0000 mg | ORAL_TABLET | Freq: Every day | ORAL | 0 refills | Status: DC
Start: 2020-05-05 — End: 2020-07-24

## 2020-05-05 NOTE — Progress Notes (Signed)
  Subjective:  Patient ID: [name], female    DOB: 24-Jul-2000,  MRN: [id number]  Chief Complaint  Patient presents with  . Foot Pain    Foot pain of right foot. Pt states no improvement.    20 y.o. female presents with the above complaint. History confirmed with patient. Did not get meloxicam as directed.  Objective:  Physical Exam: warm, good capillary refill, no trophic changes or ulcerative lesions, normal DP and PT pulses and normal sensory exam. Right Foot: POP 2nd metatarsal shaft and MPJ. POP 5th metatarsal.   Assessment:   1. Capsulitis of metatarsophalangeal (MTP) joint of right foot   2. Tendonitis of ankle or foot    Plan:  Patient was evaluated and treated and all questions answered.  Capsulitis  -Re-sent rx for meloxicam. -Discussed ankle ROM exercises to prevent forefoot pressure  Return in about 1 month (around 06/04/2020).

## 2020-05-05 NOTE — Patient Instructions (Signed)

## 2020-05-15 NOTE — Progress Notes (Signed)
  Subjective:  Patient ID: [name], female    DOB: [birth year],  MRN: [id number]  Chief Complaint  Patient presents with  . Foot Pain      (xrays)(NP)bilateral foot pain    20 y.o. female presents with the above complaint. History confirmed with patient.  Ports pain to the right greater than left foot under her toe states that she has to walk on the outside edge of her foot due to the pain  Objective:  Physical Exam: warm, good capillary refill, no trophic changes or ulcerative lesions, normal DP and PT pulses and normal sensory exam. Pain to palpation about the second third MPJs  No images are attached to the encounter.  Radiographs: X-ray of both feet: no fracture, dislocation, swelling or degenerative changes noted Assessment:   1. Tendonitis of ankle or foot   2. Metatarsalgia of both feet   3. Capsulitis of metatarsophalangeal (MTP) joints of both feet      Plan:  Patient was evaluated and treated and all questions answered.  Capsulitis -Educated on etiology -XR reviewed with patient -Discussed padding and proper shoegear  -Rx meloxicam educated on use  Return in about 4 weeks (around 05/05/2020) for Capsulitis.

## 2020-05-21 ENCOUNTER — Telehealth: Payer: Self-pay | Admitting: Adult Health

## 2020-05-21 NOTE — Telephone Encounter (Signed)
Pt calling to ask if she still needs to come 05/25/2020 for U/S & visit with Victorino Dike States she's not had a cycle since September, started spotting 05/19/2020 & today around lunch time started & so far cycle seems normal, not heavy or painful  Please advise & notify pt

## 2020-05-21 NOTE — Telephone Encounter (Signed)
Left message that I would like her to get [place] to assess ovaries to rule out PCO but if she wants to reschedule that is fine just call back

## 2020-05-21 NOTE — Telephone Encounter (Signed)
She is coming for [place] as scheduled

## 2020-05-25 ENCOUNTER — Ambulatory Visit (INDEPENDENT_AMBULATORY_CARE_PROVIDER_SITE_OTHER): Payer: 59 | Admitting: Adult Health

## 2020-05-25 ENCOUNTER — Encounter: Payer: Self-pay | Admitting: Adult Health

## 2020-05-25 ENCOUNTER — Ambulatory Visit (INDEPENDENT_AMBULATORY_CARE_PROVIDER_SITE_OTHER): Payer: 59

## 2020-05-25 ENCOUNTER — Other Ambulatory Visit: Payer: Self-pay

## 2020-05-25 VITALS — BP 139/82 | HR 84 | Ht 65.5 in | Wt 307.0 lb

## 2020-05-25 DIAGNOSIS — N911 Secondary amenorrhea: Secondary | ICD-10-CM

## 2020-05-25 DIAGNOSIS — Z319 Encounter for procreative management, unspecified: Secondary | ICD-10-CM | POA: Diagnosis not present

## 2020-05-25 DIAGNOSIS — E282 Polycystic ovarian syndrome: Secondary | ICD-10-CM | POA: Insufficient documentation

## 2020-05-25 NOTE — Progress Notes (Signed)
  Subjective:     Patient ID: [name], female   DOB: 19-Jan-2000, 20 y.o.   MRN: [id number]  HPI [name] is a 20 year old black female, with DP, lesbian, G0P0, in to review [place] to assess ovaries for PCO. She was seen 04/01/20, and had not had a period since 09/28/19. She had labs checked 04/01/20, prolactin 10.4,TSH 0.710,FSH 6.7,estradiol 64.6, and she started a period 05/21/20, on her own.  She wants to get pregnant. She says will use partners brothers semen, and self instill at home.  Lab Results  Component Value Date   DIAGPAP  03/12/2020    - Negative for intraepithelial lesion or malignancy (NILM)   Review of Systems Had period 05/21/20 Reviewed past medical,surgical, social and family history. Reviewed medications and allergies.     Objective:   Physical Exam BP 139/82 (BP Location: Left Arm, Patient Position: Sitting, Cuff Size: Normal)   Pulse 84   Ht 5' 5.5" (1.664 m)   Wt (!) 307 lb (139.3 kg)   LMP 05/21/2020 (Exact Date)   BMI 50.31 kg/m  Skin warm and dry.  Lungs: clear to ausculation bilaterally. Cardiovascular: regular rate and rhythm.  Upstream - 05/25/20 1528      Pregnancy Intention Screening   Does the patient want to become pregnant in the next year? Yes    Does the patient's partner want to become pregnant in the next year? Yes    Would the patient like to discuss contraceptive options today? No      Contraception Wrap Up   Current Method Pregnant/Seeking Pregnancy    End Method Pregnant/Seeking Pregnancy    Contraception Counseling Provided No         US showed ovaries that were elongated and had PCO appearance, will await final read.     Assessment:     1. PCO (polycystic ovaries) She is aware may need assistance, to get pregnant  If misses over 3 months on period call   2. Patient desires pregnancy Start trying to lose some weight, no alcohol or THC Take OTC PNV    Plan:     Follow up prn

## 2020-05-25 NOTE — Progress Notes (Addendum)
PELVIC US TA/TV: homogeneous axial positioned uterus,wnl,EEC 7.6 mm,bilat enlarged ovaries with mult peripheral follicles,left ovary best visualized on T/A images

## 2020-06-03 ENCOUNTER — Telehealth: Payer: Self-pay | Admitting: Adult Health

## 2020-06-03 MED ORDER — DESOGESTREL-ETHINYL ESTRADIOL 0.15-30 MG-MCG PO TABS: 1.0000 | ORAL_TABLET | Freq: Every day | ORAL | 11 refills | Status: DC

## 2020-06-03 NOTE — Telephone Encounter (Signed)
Pt would like to start [facility], does she need to be seen again (last seen 05/25/20 w/Jennifer)  Please advise & notify pt    [place]

## 2020-06-03 NOTE — Telephone Encounter (Signed)
Left message I sent in rx for birth control pills start Sunday

## 2020-06-05 ENCOUNTER — Telehealth: Payer: Self-pay | Admitting: *Deleted

## 2020-06-05 ENCOUNTER — Ambulatory Visit: Payer: 59 | Admitting: Podiatry

## 2020-06-05 NOTE — Telephone Encounter (Signed)
Rescheduled patient, thanks 

## 2020-06-05 NOTE — Telephone Encounter (Signed)
Patient is calling to cancel appointment for today. Please call back to reschedule.

## 2020-06-29 ENCOUNTER — Other Ambulatory Visit: Payer: Self-pay

## 2020-06-29 ENCOUNTER — Encounter (HOSPITAL_COMMUNITY): Payer: Self-pay | Admitting: Emergency Medicine

## 2020-06-29 ENCOUNTER — Emergency Department (HOSPITAL_COMMUNITY)
Admission: EM | Admit: 2020-06-29 | Discharge: 2020-06-29 | Disposition: A | Discharge status: Home / Self Care | Payer: 59 | Attending: Emergency Medicine | Admitting: Emergency Medicine

## 2020-06-29 DIAGNOSIS — Z8616 Personal history of COVID-19: Secondary | ICD-10-CM | POA: Insufficient documentation

## 2020-06-29 DIAGNOSIS — L259 Unspecified contact dermatitis, unspecified cause: Secondary | ICD-10-CM | POA: Insufficient documentation

## 2020-06-29 DIAGNOSIS — J45909 Unspecified asthma, uncomplicated: Secondary | ICD-10-CM | POA: Diagnosis not present

## 2020-06-29 DIAGNOSIS — Z7722 Contact with and (suspected) exposure to environmental tobacco smoke (acute) (chronic): Secondary | ICD-10-CM | POA: Diagnosis not present

## 2020-06-29 DIAGNOSIS — R21 Rash and other nonspecific skin eruption: Secondary | ICD-10-CM | POA: Diagnosis present

## 2020-06-29 MED ORDER — MUPIROCIN CALCIUM 2 % EX CREA: 1.0000 "application " | TOPICAL_CREAM | Freq: Two times a day (BID) | CUTANEOUS | 0 refills | Status: DC

## 2020-06-29 MED ORDER — METHYLPREDNISOLONE 4 MG PO TBPK: ORAL_TABLET | ORAL | 0 refills | Status: DC

## 2020-06-29 NOTE — Discharge Instructions (Addendum)
It appears that the rash you have is related to something that touched your skin, this may have been related to one of the topical products that you used at the beach, please avoid using those until you have your family doctor check you for allergy testing.  Take the Medrol Dosepak for 6 days  Use the topical mupirocin cream on these areas, try to keep them clean and dry  ER for worsening symptoms

## 2020-06-29 NOTE — ED Provider Notes (Signed)
[facility] EMERGENCY DEPARTMENT Provider Note   CSN: [id number] Arrival date & time: 06/29/20  1021     History Chief Complaint  Patient presents with   Rash    [name] is a 20 y.o. female.   Rash Associated symptoms: no fever    This patient is a 20 year old female, recently returned from the beach where she noticed that after applying some aloe vera product to her skin she had a rash that broke out within about 5 minutes of being in the sun.  This rash was initially itchy but became more blistery and tender over the next couple of days.  She went out in the sun yesterday to a party for her family member and within a short time the son was making her skin painful again.  She has a rash primarily on her upper arms but is also on her upper legs.  She has no respiratory symptoms, she has had a similar rash in the past with some type of topical product  Past Medical History:  Diagnosis Date   Asthma    DVT (deep venous thrombosis) (HCC)    age 41 had COVID   Eczema    Migraines    Obesity    PE (pulmonary thromboembolism) (HCC)    age 62 with COVID    Patient Active Problem List   Diagnosis Date Noted   PCO (polycystic ovaries) 05/25/2020   Secondary amenorrhea 04/01/2020   Patient desires pregnancy 04/01/2020   Acute pulmonary embolism (HCC) 09/01/2019   Acute respiratory failure with hypoxia (HCC) 08/24/2019   Acute respiratory disease due to COVID-19 virus 08/24/2019   Obesity, Class III, BMI 40-49.9 (morbid obesity) (HCC) 04/25/2016   Allergic conjunctivitis of both eyes 04/25/2016   Onychomycosis 04/25/2016   Chronic pain of right knee 04/25/2016   Eczema 04/25/2016   Mild intermittent asthma, uncomplicated 04/25/2016    Past Surgical History:  Procedure Laterality Date   FOOT SURGERY     bilateral arch repair     OB History     Gravida  0   Para  0   Term  0   Preterm  0   AB  0   Living  0      SAB  0   IAB  0   Ectopic  0    Multiple  0   Live Births  0           Family History  Problem Relation Age of Onset   Healthy Mother    Breast cancer Mother    Schizophrenia Mother    Migraines Mother    Hypertension Paternal Grandfather    Heart disease Maternal Grandmother    Bipolar disorder Father    Schizophrenia Father    Asthma Father    Migraines Father    Asthma Half-Brother    Asthma Half-Sister    Asthma Half-Sister    Migraines Half-Sister    Migraines Half-Sister    Asthma Half-Sister     Social History   Tobacco Use   Smoking status: Passive Smoke Exposure - Never Smoker   Smokeless tobacco: Never  Vaping Use   Vaping Use: Never used  Substance Use Topics   Alcohol use: Yes    Comment: occ   Drug use: Not Currently    Types: Marijuana    Home Medications Prior to Admission medications   Medication Sig Start Date End Date Taking? Authorizing Provider  methylPREDNISolone (MEDROL DOSEPAK) 4 MG TBPK  tablet Taper over 6 days 06/29/20  Yes Eber Hong, MD  mupirocin cream (BACTROBAN) 2 % Apply 1 application topically 2 (two) times daily. 06/29/20  Yes Eber Hong, MD  albuterol (VENTOLIN HFA) 108 (90 Base) MCG/ACT inhaler Inhale 2 puffs into the lungs every 6 (six) hours as needed for wheezing or shortness of breath.    [provider]  desogestrel-ethinyl estradiol (APRI) 0.15-30 MG-MCG tablet Take 1 tablet by mouth daily. 06/03/20 06/03/21  Adline Potter, NP  ibuprofen (ADVIL) 800 MG tablet Take 1 tablet (800 mg total) by mouth every 8 (eight) hours as needed. 01/23/20   Elson Areas, PA-C  meloxicam (MOBIC) 15 MG tablet Take 1 tablet (15 mg total) by mouth daily. 05/05/20   Park Liter, DPM    Allergies    Hydrocortisone-emollient  Review of Systems   Review of Systems  Constitutional:  Negative for fever.  Skin:  Positive for rash.   Physical Exam Updated Vital Signs BP 133/82   Pulse 79   Temp 98.4 F (36.9 C)   Resp 18   Ht 1.664 m (5' 5.5")    Wt (!) 137.9 kg   SpO2 100%   BMI 49.82 kg/m   Physical Exam Vitals and nursing note reviewed.  Constitutional:      Appearance: She is well-developed. She is not diaphoretic.  HENT:     Head: Normocephalic and atraumatic.  Eyes:     General:        Right eye: No discharge.        Left eye: No discharge.     Conjunctiva/sclera: Conjunctivae normal.  Pulmonary:     Effort: Pulmonary effort is normal. No respiratory distress.  Skin:    General: Skin is warm and dry.     Findings: Rash present. No erythema.     Comments: Vesiculopapular rash to the bilateral shoulders bilateral thighs, nothing on the face  Neurological:     Mental Status: She is alert.     Coordination: Coordination normal.    ED Results / Procedures / Treatments   Labs (all labs ordered are listed, but only abnormal results are displayed) Labs Reviewed - No data to display  EKG None  Radiology No results found.  Procedures Procedures   Medications Ordered in ED Medications - No data to display  ED Course  I have reviewed the triage vital signs and the nursing notes.  Pertinent labs & imaging results that were available during my care of the patient were reviewed by me and considered in my medical decision making (see chart for details).    MDM Rules/Calculators/A&P                          I suspect this is a contact dermatitis, the patient will be given a Medrol Dosepak as well as some mupirocin cream topically.  She is agreeable to the plan and can follow-up for allergy testing, no other significant symptoms or findings on exam, very stable appearing  Final Clinical Impression(s) / ED Diagnoses Final diagnoses:  Generalized rash  Contact dermatitis, unspecified contact dermatitis type, unspecified trigger    Rx / DC Orders ED Discharge Orders          Ordered    methylPREDNISolone (MEDROL DOSEPAK) 4 MG TBPK tablet        06/29/20 1042    mupirocin cream (BACTROBAN) 2 %  2 times  daily  06/29/20 1042             Eber Hong, MD 06/29/20 1044

## 2020-06-29 NOTE — ED Triage Notes (Signed)
Pt c/o generalized rash to arms/legs/back, worse in sunlight.

## 2020-07-03 ENCOUNTER — Ambulatory Visit (INDEPENDENT_AMBULATORY_CARE_PROVIDER_SITE_OTHER): Payer: 59 | Admitting: Podiatry

## 2020-07-03 DIAGNOSIS — Z5329 Procedure and treatment not carried out because of patient's decision for other reasons: Secondary | ICD-10-CM

## 2020-07-07 NOTE — Telephone Encounter (Signed)
Last message from star.

## 2020-07-24 ENCOUNTER — Encounter: Payer: Self-pay | Admitting: Emergency Medicine

## 2020-07-24 ENCOUNTER — Other Ambulatory Visit: Payer: Self-pay

## 2020-07-24 ENCOUNTER — Ambulatory Visit
Admission: EM | Admit: 2020-07-24 | Discharge: 2020-07-24 | Disposition: A | Discharge status: Home / Self Care | Payer: MEDICAID | Attending: Emergency Medicine | Admitting: Emergency Medicine

## 2020-07-24 DIAGNOSIS — R109 Unspecified abdominal pain: Secondary | ICD-10-CM | POA: Diagnosis not present

## 2020-07-24 DIAGNOSIS — R112 Nausea with vomiting, unspecified: Secondary | ICD-10-CM

## 2020-07-24 MED ORDER — LIDOCAINE VISCOUS HCL 2 % MT SOLN
15.0000 mL | Freq: Once | OROMUCOSAL | Status: AC
  Administered 2020-07-24: 15 mL via ORAL

## 2020-07-24 MED ORDER — ONDANSETRON 4 MG PO TBDP
4.0000 mg | ORAL_TABLET | Freq: Once | ORAL | Status: AC
  Administered 2020-07-24: 4 mg via ORAL

## 2020-07-24 MED ORDER — DICYCLOMINE HCL 20 MG PO TABS: 20.0000 mg | ORAL_TABLET | Freq: Two times a day (BID) | ORAL | 0 refills | Status: DC

## 2020-07-24 MED ORDER — ONDANSETRON HCL 4 MG PO TABS: 4.0000 mg | ORAL_TABLET | Freq: Four times a day (QID) | ORAL | 0 refills | Status: DC

## 2020-07-24 MED ORDER — ALUM & MAG HYDROXIDE-SIMETH 200-200-20 MG/5ML PO SUSP
30.0000 mL | Freq: Once | ORAL | Status: AC
  Administered 2020-07-24: 30 mL via ORAL

## 2020-07-24 NOTE — ED Provider Notes (Signed)
[facility] CARE CENTER   [id number] 07/24/20 Arrival Time: 1021  CC: ABDOMINAL DISCOMFORT  SUBJECTIVE:  [name] is a 20 y.o. female who presents with complaint of abdominal discomfort, nausea, and vomiting (x 3 episodes daily) that began 5 days ago.  Denies a precipitating event, trauma, close contacts with similar symptoms, recent travel or antibiotic use.  Reports starting birth control pills recently.  Generalized abdominal pain.   Describes as intermittent achy, cramping in character.  Has tried pepto without relief.  Worse with eating.  Reports similar symptoms in the past.   Denies fever, chills, chest pain, SOB, diarrhea, constipation, hematochezia, melena, dysuria, difficulty urinating, increased frequency or urgency, flank pain, loss of bowel or bladder function, vaginal discharge, vaginal odor, vaginal bleeding, dyspareunia, pelvic pain.     No LMP recorded. (Menstrual status: Irregular Periods). No concern for pregnancy.    ROS: As per HPI.  All other pertinent ROS negative.     Past Medical History:  Diagnosis Date   Asthma    DVT (deep venous thrombosis) (HCC)    age 73 had COVID   Eczema    Migraines    Obesity    PE (pulmonary thromboembolism) (HCC)    age 51 with COVID   Past Surgical History:  Procedure Laterality Date   FOOT SURGERY     bilateral arch repair   Allergies  Allergen Reactions   Hydrocortisone-Emollient Hives   No current facility-administered medications on file prior to encounter.   Current Outpatient Medications on File Prior to Encounter  Medication Sig Dispense Refill   desogestrel-ethinyl estradiol (APRI) 0.15-30 MG-MCG tablet Take 1 tablet by mouth daily. 28 tablet 11   Social History   Socioeconomic History   Marital status: Single    Spouse name: Not on file   Number of children: Not on file   Years of education: Not on file   Highest education level: Not on file  Occupational History   Not on file  Tobacco Use    Smoking status: Passive Smoke Exposure - Never Smoker   Smokeless tobacco: Never  Vaping Use   Vaping Use: Never used  Substance and Sexual Activity   Alcohol use: Yes    Comment: occ   Drug use: Not Currently    Types: Marijuana   Sexual activity: Yes    Birth control/protection: Other-see comments    Comment: Lesbian  Other Topics Concern   Not on file  Social History Narrative   12th grade      Lives with mother, sister, niece       Smokers inside    Social Determinants of Health   Financial Resource Strain: Low Risk    Difficulty of Paying Living Expenses: Not very hard  Food Insecurity: No Food Insecurity   Worried About Programme researcher, broadcasting/film/video in the Last Year: Never true   Ran Out of Food in the Last Year: Never true  Transportation Needs: No Transportation Needs   Lack of Transportation (Medical): No   Lack of Transportation (Non-Medical): No  Physical Activity: Sufficiently Active   Days of Exercise per Week: 7 days   Minutes of Exercise per Session: 30 min  Stress: No Stress Concern Present   Feeling of Stress : Only a little  Social Connections: Moderately Integrated   Frequency of Communication with Friends and Family: More than three times a week   Frequency of Social Gatherings with Friends and Family: Three times a week   Attends Religious Services:  1 to 4 times per year   Active Member of Clubs or Organizations: No   Attends Banker Meetings: Never   Marital Status: Living with partner  Intimate Partner Violence: Not At Risk   Fear of Current or Ex-Partner: No   Emotionally Abused: No   Physically Abused: No   Sexually Abused: No   Family History  Problem Relation Age of Onset   Healthy Mother    Breast cancer Mother    Schizophrenia Mother    Migraines Mother    Hypertension Paternal Grandfather    Heart disease Maternal Grandmother    Bipolar disorder Father    Schizophrenia Father    Asthma Father    Migraines Father    Asthma  Half-Brother    Asthma Half-Sister    Asthma Half-Sister    Migraines Half-Sister    Migraines Half-Sister    Asthma Half-Sister      OBJECTIVE:  Vitals:   07/24/20 1030 07/24/20 1032  BP: 124/86   Pulse: 87   Resp: 20   Temp: 99 F (37.2 C)   TempSrc: Oral   SpO2: 98%   Weight:  135 lb (61.2 kg)  Height:  5\' 6"  (1.676 m)    General appearance: Alert; NAD HEENT: NCAT.  Oropharynx clear.  Lungs: clear to auscultation bilaterally without adventitious breath sounds Heart: regular rate and rhythm.   Abdomen: soft, non-distended; normal active bowel sounds; non-tender to light and deep palpation; no guarding Extremities: no edema; symmetrical with no gross deformities Skin: warm and dry Neurologic: normal gait Psychological: alert and cooperative; normal mood and affect   ASSESSMENT & PLAN:  1. Abdominal cramping   2. Non-intractable vomiting with nausea, unspecified vomiting type     Meds ordered this encounter  Medications   AND Linked Order Group    alum & mag hydroxide-simeth (MAALOX/MYLANTA) 200-200-20 MG/5ML suspension 30 mL    lidocaine (XYLOCAINE) 2 % viscous mouth solution 15 mL   ondansetron (ZOFRAN-ODT) disintegrating tablet 4 mg   dicyclomine (BENTYL) 20 MG tablet    Sig: Take 1 tablet (20 mg total) by mouth 2 (two) times daily.    Dispense:  20 tablet    Refill:  0    Order Specific Question:   Supervising Provider    Answer:   Eustace Moore   ondansetron (ZOFRAN) 4 MG tablet    Sig: Take 1 tablet (4 mg total) by mouth every 6 (six) hours.    Dispense:  12 tablet    Refill:  0    Order Specific Question:   Supervising Provider    Answer:   [[id number]] Eustace Moore     Get rest and drink fluids Bentyl for abdominal discomfort Zofran prescribed.  Take as directed.    DIET Instructions:  30 minutes after taking nausea medicine, begin with sips of clear liquids. If able to hold down 2 - 4 ounces for 30 minutes, begin drinking  more. Increase your fluid intake to replace losses. Clear liquids only for 24 hours (water, tea, sport drinks, clear flat ginger ale or cola and juices, broth, jello, popsicles, ect). Advance to bland foods, applesauce, rice, baked or boiled chicken, ect. Avoid milk, greasy foods and anything that doesn't agree with you.  If you experience new or worsening symptoms return or go to ER such as fever, chills, nausea, vomiting, diarrhea, bloody or dark tarry stools, constipation, urinary symptoms, worsening abdominal discomfort, symptoms that do not improve with medications, inability  to keep fluids down, etc...  Reviewed expectations re: course of current medical issues. Questions answered. Outlined signs and symptoms indicating need for more acute intervention. Patient verbalized understanding. After Visit Summary given.   Rennis Harding, PA-C 07/24/20 1055

## 2020-07-24 NOTE — ED Triage Notes (Signed)
Pt here with chronic abdominal pain but has become more acute since Monday. States it often occurs after eating or eating something spicy. Has thrown up once today after eating. Pain is worse when moving around.

## 2020-07-24 NOTE — Discharge Instructions (Addendum)
Zofran and GI cocktail given in office Get rest and drink fluids Bentyl for abdominal discomfort Zofran prescribed.  Take as directed.    DIET Instructions:  30 minutes after taking nausea medicine, begin with sips of clear liquids. If able to hold down 2 - 4 ounces for 30 minutes, begin drinking more. Increase your fluid intake to replace losses. Clear liquids only for 24 hours (water, tea, sport drinks, clear flat ginger ale or cola and juices, broth, jello, popsicles, ect). Advance to bland foods, applesauce, rice, baked or boiled chicken, ect. Avoid milk, greasy foods and anything that doesn't agree with you.  If you experience new or worsening symptoms return or go to ER such as fever, chills, nausea, vomiting, diarrhea, bloody or dark tarry stools, constipation, urinary symptoms, worsening abdominal discomfort, symptoms that do not improve with medications, inability to keep fluids down, etc..Marland Kitchen

## 2020-11-02 ENCOUNTER — Ambulatory Visit (INDEPENDENT_AMBULATORY_CARE_PROVIDER_SITE_OTHER): Payer: 59 | Admitting: Adult Health

## 2020-11-02 ENCOUNTER — Other Ambulatory Visit: Payer: Self-pay

## 2020-11-02 ENCOUNTER — Encounter: Payer: Self-pay | Admitting: Adult Health

## 2020-11-02 VITALS — BP 136/84 | HR 72 | Ht 65.5 in | Wt 306.0 lb

## 2020-11-02 DIAGNOSIS — Z319 Encounter for procreative management, unspecified: Secondary | ICD-10-CM

## 2020-11-02 DIAGNOSIS — N926 Irregular menstruation, unspecified: Secondary | ICD-10-CM | POA: Diagnosis not present

## 2020-11-02 DIAGNOSIS — E282 Polycystic ovarian syndrome: Secondary | ICD-10-CM | POA: Diagnosis not present

## 2020-11-02 MED ORDER — NORETHINDRONE 0.35 MG PO TABS: 1.0000 | ORAL_TABLET | Freq: Every day | ORAL | 11 refills | Status: DC

## 2020-11-02 NOTE — Progress Notes (Signed)
  Subjective:     Patient ID: [name], female   DOB: [birth year], 20 y.o.   MRN: [id number]  HPI [name] is a 20 year old black female, lesbian, single with DP, G0P0, wants to get pregnant. Had period 05/21/20 and when in birth control was regular and stopped OCs and no period since 07/30/20, US showed PCO 05/25/20.Partners brother will give semen when they are ready.  She had PE and DVT after COVID. PCP is Coral Ceo NP Lab Results  Component Value Date   DIAGPAP  03/12/2020    - Negative for intraepithelial lesion or malignancy (NILM)     Review of Systems Irregular periods Reviewed past medical,surgical, social and family history. Reviewed medications and allergies.     Objective:   Physical Exam BP 136/84 (BP Location: Left Arm, Patient Position: Sitting, Cuff Size: Large)   Pulse 72   Ht 5' 5.5" (1.664 m)   Wt (!) 306 lb (138.8 kg)   LMP 07/30/2020   BMI 50.15 kg/m     Skin warm and dry.  Lungs: clear to ausculation bilaterally. Cardiovascular: regular rate and rhythm.  Has lost 1 lbs  Upstream - 11/02/20 1614       Pregnancy Intention Screening   Does the patient want to become pregnant in the next year? Yes    Does the patient's partner want to become pregnant in the next year? Yes    Would the patient like to discuss contraceptive options today? No      Contraception Wrap Up   Current Method Pregnant/Seeking Pregnancy    End Method Pregnant/Seeking Pregnancy    Contraception Counseling Provided No             Assessment:     1. Patient desires pregnancy Take OTC PN Gummies  2. PCO (polycystic ovaries)   3. Irregular periods Will try to cycle with Micronor, discussed with Dr Charlotta Newton Meds ordered this encounter  Medications   norethindrone (MICRONOR) 0.35 MG tablet    Sig: Take 1 tablet (0.35 mg total) by mouth daily.    Dispense:  28 tablet    Refill:  11    Order Specific Question:   Supervising Provider    Answer:   Lazaro Arms [[id number]]        Plan:     Wants to try in January Take Micronor to end of December and stop and see if gets period May need to go to [facility] specialist

## 2021-02-02 ENCOUNTER — Other Ambulatory Visit: Payer: 59

## 2021-02-02 ENCOUNTER — Ambulatory Visit: Payer: Medicaid Other | Admitting: Adult Health

## 2021-02-10 ENCOUNTER — Other Ambulatory Visit: Payer: Self-pay

## 2021-02-10 ENCOUNTER — Encounter (HOSPITAL_COMMUNITY): Payer: Self-pay

## 2021-02-10 ENCOUNTER — Emergency Department (HOSPITAL_COMMUNITY)
Admission: EM | Admit: 2021-02-10 | Discharge: 2021-02-10 | Disposition: A | Discharge status: Home / Self Care | Payer: Medicaid Other | Attending: Emergency Medicine | Admitting: Emergency Medicine

## 2021-02-10 DIAGNOSIS — Z20822 Contact with and (suspected) exposure to covid-19: Secondary | ICD-10-CM | POA: Insufficient documentation

## 2021-02-10 DIAGNOSIS — J039 Acute tonsillitis, unspecified: Secondary | ICD-10-CM | POA: Insufficient documentation

## 2021-02-10 DIAGNOSIS — R000 Tachycardia, unspecified: Secondary | ICD-10-CM | POA: Insufficient documentation

## 2021-02-10 LAB — RESP PANEL BY RT-PCR (FLU A&B, COVID) ARPGX2
Influenza A by PCR: NEGATIVE
Influenza B by PCR: NEGATIVE
SARS Coronavirus 2 by RT PCR: NEGATIVE

## 2021-02-10 LAB — GROUP A STREP BY PCR: Group A Strep by PCR: NOT DETECTED

## 2021-02-10 MED ORDER — AMOXICILLIN 500 MG PO CAPS: 500.0000 mg | ORAL_CAPSULE | Freq: Three times a day (TID) | ORAL | 0 refills | Status: DC

## 2021-02-10 MED ORDER — AMOXICILLIN 250 MG PO CAPS
500.0000 mg | ORAL_CAPSULE | Freq: Once | ORAL | Status: AC
  Administered 2021-02-10: 500 mg via ORAL
  Filled 2021-02-10: qty 2

## 2021-02-10 MED ORDER — ACETAMINOPHEN 325 MG PO TABS
650.0000 mg | ORAL_TABLET | Freq: Once | ORAL | Status: AC | PRN
  Administered 2021-02-10: 650 mg via ORAL
  Filled 2021-02-10: qty 2

## 2021-02-10 MED ORDER — DEXAMETHASONE 4 MG PO TABS
10.0000 mg | ORAL_TABLET | Freq: Once | ORAL | Status: AC
  Administered 2021-02-10: 10 mg via ORAL
  Filled 2021-02-10: qty 3

## 2021-02-10 NOTE — ED Triage Notes (Signed)
Pt presents to ED stating she made herself throw up yesterday because she felt like something was in her throat. Today she feels like her throat is swollen and states her tonsils have white spots on it.

## 2021-02-11 NOTE — ED Provider Notes (Signed)
[facility] EMERGENCY DEPARTMENT Provider Note   CSN: [id number] Arrival date & time: 02/10/21  1728     History  Chief Complaint  Patient presents with   Sore Throat    [name] is a 21 y.o. female.  The history is provided by the patient.  Sore Throat This is a new problem. The current episode started yesterday. The problem occurs constantly. The problem has been gradually worsening. Associated symptoms include headaches. Pertinent negatives include no chest pain and no shortness of breath. The symptoms are aggravated by swallowing. Nothing relieves the symptoms.  Patient reports over the past day she has had sore throat.  She has had fevers.  She reports she made her self throw up yesterday and started hurting after that.  She has had mild cough She is able to drink fluids but it hurts    Home Medications Prior to Admission medications   Medication Sig Start Date End Date Taking? Authorizing Provider  amoxicillin (AMOXIL) 500 MG capsule Take 1 capsule (500 mg total) by mouth 3 (three) times daily. 02/10/21  Yes [name], MD  norethindrone (MICRONOR) 0.35 MG tablet Take 1 tablet (0.35 mg total) by mouth daily. 11/02/20   Adline Potter, NP      Allergies    Hydrocortisone-emollient    Review of Systems   Review of Systems  Constitutional:  Positive for fatigue and fever.  HENT:  Positive for sore throat.   Respiratory:  Negative for shortness of breath.   Cardiovascular:  Negative for chest pain.  Gastrointestinal:  Positive for vomiting.  Neurological:  Positive for headaches.   Physical Exam Updated Vital Signs BP 132/89 (BP Location: Right Arm)    Pulse 85    Temp 98.8 F (37.1 C) (Oral)    Resp 18    Ht 1.651 m (5\' 5" )    Wt (!) 137.9 kg    SpO2 98%    BMI 50.59 kg/m  Physical Exam CONSTITUTIONAL: Well developed/well nourished HEAD: Normocephalic/atraumatic EYES: EOMI/PERRL ENMT: Mucous membranes moist, no drooling, no stridor, no dysphonia.   Uvula midline however tonsils are enlarged with erythema and exudates. There is no uvular edema. NECK: supple no meningeal signs, no anterior neck edema, no crepitus to her neck SPINE/BACK:entire spine nontender CV: S1/S2 noted, no murmurs/rubs/gallops noted LUNGS: Lungs are clear to auscultation bilaterally, no apparent distress ABDOMEN: soft NEURO: Pt is awake/alert/appropriate, moves all extremitiesx4.  No facial droop.   EXTREMITIES: full ROM SKIN: warm, color normal PSYCH: no abnormalities of mood noted, alert and oriented to situation  ED Results / Procedures / Treatments   Labs (all labs ordered are listed, but only abnormal results are displayed) Labs Reviewed  GROUP A STREP BY PCR  RESP PANEL BY RT-PCR (FLU A&B, COVID) [id number]    EKG None  Radiology No results found.  Procedures Procedures    Medications Ordered in ED Medications  acetaminophen (TYLENOL) tablet 650 mg (650 mg Oral Given 02/10/21 1804)  amoxicillin (AMOXIL) capsule 500 mg (500 mg Oral Given 02/10/21 2252)  dexamethasone (DECADRON) tablet 10 mg (10 mg Oral Given 02/10/21 2253)    ED Course/ Medical Decision Making/ A&P                           Medical Decision Making Risk OTC drugs. Prescription drug management.   Patient presents with sore throat.  She was noted to be febrile and tachycardic.  Her vitals have improved  after receiving Tylenol She is in no acute distress and is able to drink fluids. Due to presentation, will start empiric antibiotics. Fortunately her viral panel is negative, no signs of COVID or flu. She is otherwise appropriate for outpatient management.  We discussed strict return precautions        Final Clinical Impression(s) / ED Diagnoses Final diagnoses:  Tonsillitis    Rx / DC Orders ED Discharge Orders          Ordered    amoxicillin (AMOXIL) 500 MG capsule  3 times daily        02/10/21 2243              [name], MD 02/11/21 0002

## 2021-02-14 ENCOUNTER — Encounter (HOSPITAL_COMMUNITY): Payer: Self-pay

## 2021-02-14 ENCOUNTER — Other Ambulatory Visit: Payer: Self-pay

## 2021-02-14 ENCOUNTER — Emergency Department (HOSPITAL_COMMUNITY)
Admission: EM | Admit: 2021-02-14 | Discharge: 2021-02-14 | Disposition: A | Discharge status: Home / Self Care | Payer: Self-pay | Attending: Emergency Medicine | Admitting: Emergency Medicine

## 2021-02-14 DIAGNOSIS — J45909 Unspecified asthma, uncomplicated: Secondary | ICD-10-CM | POA: Insufficient documentation

## 2021-02-14 DIAGNOSIS — J029 Acute pharyngitis, unspecified: Secondary | ICD-10-CM | POA: Insufficient documentation

## 2021-02-14 DIAGNOSIS — H9203 Otalgia, bilateral: Secondary | ICD-10-CM | POA: Insufficient documentation

## 2021-02-14 DIAGNOSIS — Z7722 Contact with and (suspected) exposure to environmental tobacco smoke (acute) (chronic): Secondary | ICD-10-CM | POA: Insufficient documentation

## 2021-02-14 DIAGNOSIS — Z8616 Personal history of COVID-19: Secondary | ICD-10-CM | POA: Insufficient documentation

## 2021-02-14 MED ORDER — DEXAMETHASONE 4 MG PO TABS
10.0000 mg | ORAL_TABLET | Freq: Once | ORAL | Status: AC
  Administered 2021-02-14: 10 mg via ORAL
  Filled 2021-02-14: qty 3

## 2021-02-14 NOTE — ED Provider Notes (Signed)
[facility] Emergency Department Provider Note MRN:  [id number]  Arrival date & time: 02/14/21     Chief Complaint   Sore throat History of Present Illness   [name] is a 21 y.o. year-old female with a history of PE presenting to the ED with chief complaint of sore throat.  Persistent sore throat for 5 days.  Also having some mild ear discomfort bilaterally.  Had some fever several days ago but none since.  No cough, no chest pain or shortness of breath.  Review of Systems  A thorough review of systems was obtained and all systems are negative except as noted in the HPI and PMH.   Patient's Health History    Past Medical History:  Diagnosis Date   Asthma    DVT (deep venous thrombosis) (HCC)    age 60 had COVID   Eczema    Migraines    Obesity    PE (pulmonary thromboembolism) ([place])    age 42 with COVID    Past Surgical History:  Procedure Laterality Date   FOOT SURGERY     bilateral arch repair    Family History  Problem Relation Age of Onset   Healthy Mother    Breast cancer Mother    Schizophrenia Mother    Migraines Mother    Hypertension Paternal Grandfather    Heart disease Maternal Grandmother    Bipolar disorder Father    Schizophrenia Father    Asthma Father    Migraines Father    Asthma Half-Brother    Asthma Half-Sister    Asthma Half-Sister    Migraines Half-Sister    Migraines Half-Sister    Asthma Half-Sister     Social History   Socioeconomic History   Marital status: Single    Spouse name: Not on file   Number of children: Not on file   Years of education: Not on file   Highest education level: Not on file  Occupational History   Not on file  Tobacco Use   Smoking status: Never    Passive exposure: Yes   Smokeless tobacco: Never  Vaping Use   Vaping Use: Never used  Substance and Sexual Activity   Alcohol use: Yes    Comment: occ   Drug use: Not Currently    Types: Marijuana   Sexual activity:  Yes    Birth control/protection: Other-see comments    Comment: Lesbian  Other Topics Concern   Not on file  Social History Narrative   12th grade      Lives with mother, sister, niece       Smokers inside    Social Determinants of Health   Financial Resource Strain: Low Risk    Difficulty of Paying Living Expenses: Not very hard  Food Insecurity: No Food Insecurity   Worried About Charity fundraiser in the Last Year: Never true   Ran Out of Food in the Last Year: Never true  Transportation Needs: No Transportation Needs   Lack of Transportation (Medical): No   Lack of Transportation (Non-Medical): No  Physical Activity: Sufficiently Active   Days of Exercise per Week: 7 days   Minutes of Exercise per Session: 30 min  Stress: No Stress Concern Present   Feeling of Stress : Only a little  Social Connections: Moderately Integrated   Frequency of Communication with Friends and Family: More than three times a week   Frequency of Social Gatherings with Friends and Family: Three times a  week   Attends Religious Services: 1 to 4 times per year   Active Member of Clubs or Organizations: No   Attends Archivist Meetings: Never   Marital Status: Living with partner  Intimate Partner Violence: Not At Risk   Fear of Current or Ex-Partner: No   Emotionally Abused: No   Physically Abused: No   Sexually Abused: No     Physical Exam   Vitals:   02/14/21 0333  BP: (!) 140/98  Pulse: 77  Resp: 18  Temp: 98.1 F (36.7 C)  SpO2: 98%    CONSTITUTIONAL: Well-appearing, NAD NEURO/PSYCH:  Alert and oriented x 3, no focal deficits EYES:  eyes equal and reactive ENT/NECK:  no LAD, no JVD; erythematous and swollen tonsils bilaterally CARDIO: Regular rate, well-perfused, normal S1 and S2 PULM:  CTAB no wheezing or rhonchi GI/GU:  non-distended, non-tender MSK/SPINE:  No gross deformities, no edema SKIN:  no rash, atraumatic   *Additional and/or pertinent findings  included in MDM below  Diagnostic and Interventional Summary    EKG Interpretation  Date/Time:    Ventricular Rate:    PR Interval:    QRS Duration:   QT Interval:    QTC Calculation:   R Axis:     Text Interpretation:         Labs Reviewed - No data to display  No orders to display    Medications  dexamethasone (DECADRON) tablet 10 mg (has no administration in time range)     Procedures  /  Critical Care Procedures  ED Course and Medical Decision Making  Initial Impression and Ddx Suspect tonsillitis, was seen in the emergency department few days ago.  Strep test was negative, COVID flu negative.  Likely viral, was prescribed amoxicillin but has not been able to fill it yet.  Providing single dose of Decadron to help with the swelling and pain.  Nothing on exam to suggest peritonsillar abscess or retropharyngeal abscess, appropriate for discharge.  Past medical/surgical history that increases complexity of ED encounter: None  Interpretation of Diagnostics Not applicable  Patient Reassessment and Ultimate Disposition/Management Discharge home  Patient management required discussion with the following services or consulting groups:  None  Complexity of Problems Addressed Acute complicated illness or Injury  Additional Data Reviewed and Analyzed Further history obtained from: Prior labs/imaging results  Factors Impacting ED Encounter Risk None  Barth Kirks. Sedonia Small, MD [place] [email or web address]  Final Clinical Impressions(s) / ED Diagnoses     ICD-10-CM   1. Sore throat  J02.9       ED Discharge Orders     None        Discharge Instructions Discussed with and Provided to Patient:    Discharge Instructions      You were evaluated in the Emergency Department and after careful evaluation, we did not find any emergent condition requiring admission or further testing in the hospital.  Your  exam/testing today was overall reassuring.  Symptoms seem to be due to a viral illness.  Recommend continued use of anti-inflammatories at home for pain.  Fill the prescription for the amoxicillin.  Please return to the Emergency Department if you experience any worsening of your condition.  Thank you for allowing [place] to be a part of your care.       Maudie Flakes, MD 02/14/21 [phone]

## 2021-02-14 NOTE — Discharge Instructions (Signed)
You were evaluated in the Emergency Department and after careful evaluation, we did not find any emergent condition requiring admission or further testing in the hospital.  Your exam/testing today was overall reassuring.  Symptoms seem to be due to a viral illness.  Recommend continued use of anti-inflammatories at home for pain.  Fill the prescription for the amoxicillin.  Please return to the Emergency Department if you experience any worsening of your condition.  Thank you for allowing [place] to be a part of your care.

## 2021-02-14 NOTE — ED Triage Notes (Signed)
Pt has sore throat- pt seen here couple days ago for the same- given Rx for amoxicillin- pt did not go get this-pt is not any better.

## 2021-05-13 ENCOUNTER — Encounter: Payer: Self-pay | Admitting: *Deleted

## 2021-06-27 ENCOUNTER — Encounter (HOSPITAL_COMMUNITY): Payer: Self-pay

## 2021-06-27 ENCOUNTER — Other Ambulatory Visit: Payer: Self-pay

## 2021-06-27 DIAGNOSIS — L739 Follicular disorder, unspecified: Secondary | ICD-10-CM | POA: Insufficient documentation

## 2021-06-27 DIAGNOSIS — L259 Unspecified contact dermatitis, unspecified cause: Secondary | ICD-10-CM | POA: Insufficient documentation

## 2021-06-27 NOTE — ED Triage Notes (Signed)
Pt has pruritic, vesicular rash to bilateral lower extremities. Has gradually worsened over past 4 days.

## 2021-06-28 ENCOUNTER — Emergency Department (HOSPITAL_COMMUNITY)
Admission: EM | Admit: 2021-06-28 | Discharge: 2021-06-28 | Disposition: A | Discharge status: Home / Self Care | Payer: Self-pay | Attending: Emergency Medicine | Admitting: Emergency Medicine

## 2021-06-28 DIAGNOSIS — L259 Unspecified contact dermatitis, unspecified cause: Secondary | ICD-10-CM

## 2021-06-28 DIAGNOSIS — L739 Follicular disorder, unspecified: Secondary | ICD-10-CM

## 2021-06-28 MED ORDER — DEXAMETHASONE SODIUM PHOSPHATE 10 MG/ML IJ SOLN
10.0000 mg | Freq: Once | INTRAMUSCULAR | Status: AC
  Administered 2021-06-28: 10 mg via INTRAMUSCULAR
  Filled 2021-06-28: qty 1

## 2021-06-28 MED ORDER — DOXYCYCLINE HYCLATE 100 MG PO CAPS: 100.0000 mg | ORAL_CAPSULE | Freq: Two times a day (BID) | ORAL | 0 refills | Status: DC

## 2021-06-28 MED ORDER — DOXYCYCLINE HYCLATE 100 MG PO TABS
100.0000 mg | ORAL_TABLET | Freq: Once | ORAL | Status: AC
  Administered 2021-06-28: 100 mg via ORAL
  Filled 2021-06-28: qty 1

## 2021-06-28 NOTE — ED Provider Notes (Signed)
[facility] EMERGENCY DEPARTMENT Provider Note   CSN: [id number] Arrival date & time: 06/27/21  2002     History  Chief Complaint  Patient presents with   Rash    [name] is a 21 y.o. female.  Patient presents to rash on her legs.  Patient reports initial rash was on the posterior calf of the right leg, has spread to the left leg now.  Patient reports redness, bumps that are painful and somewhat itchy.       Home Medications Prior to Admission medications   Medication Sig Start Date End Date Taking? Authorizing Provider  doxycycline (VIBRAMYCIN) 100 MG capsule Take 1 capsule (100 mg total) by mouth 2 (two) times daily. 06/28/21  Yes Thomasina Housley, Canary Brim, MD  norethindrone (MICRONOR) 0.35 MG tablet Take 1 tablet (0.35 mg total) by mouth daily. 11/02/20   Adline Potter, NP      Allergies    Hydrocortisone-emollient    Review of Systems   Review of Systems  Physical Exam Updated Vital Signs BP 118/68 (BP Location: Right Arm)   Pulse (!) 102   Temp 98.1 F (36.7 C) (Oral)   Resp 20   Ht 5' 5.5" (1.664 m)   Wt (!) 137.9 kg   LMP  (LMP Unknown)   SpO2 99%   BMI 49.82 kg/m  Physical Exam Vitals and nursing note reviewed.  Constitutional:      General: She is not in acute distress.    Appearance: She is well-developed.  HENT:     Head: Normocephalic and atraumatic.     Mouth/Throat:     Mouth: Mucous membranes are moist.  Eyes:     General: Vision grossly intact. Gaze aligned appropriately.     Extraocular Movements: Extraocular movements intact.     Conjunctiva/sclera: Conjunctivae normal.  Cardiovascular:     Rate and Rhythm: Normal rate and regular rhythm.     Pulses: Normal pulses.     Heart sounds: Normal heart sounds, S1 normal and S2 normal. No murmur heard.    No friction rub. No gallop.  Pulmonary:     Effort: Pulmonary effort is normal. No respiratory distress.     Breath sounds: Normal breath sounds.  Abdominal:     General:  Bowel sounds are normal.     Palpations: Abdomen is soft.     Tenderness: There is no abdominal tenderness. There is no guarding or rebound.     Hernia: No hernia is present.  Musculoskeletal:        General: No swelling.     Cervical back: Full passive range of motion without pain, normal range of motion and neck supple. No spinous process tenderness or muscular tenderness. Normal range of motion.     Right lower leg: No edema.     Left lower leg: No edema.  Skin:    General: Skin is warm and dry.     Capillary Refill: Capillary refill takes less than 2 seconds.     Findings: Rash (Bilateral lower leg vesicular and pustular eruptions on raised erythematous base) present. No ecchymosis, erythema or wound.  Neurological:     General: No focal deficit present.     Mental Status: She is alert and oriented to person, place, and time.     GCS: GCS eye subscore is 4. GCS verbal subscore is 5. GCS motor subscore is 6.     Cranial Nerves: Cranial nerves 2-12 are intact.     Sensory: Sensation is  intact.     Motor: Motor function is intact.     Coordination: Coordination is intact.  Psychiatric:        Attention and Perception: Attention normal.        Mood and Affect: Mood normal.        Speech: Speech normal.        Behavior: Behavior normal.     ED Results / Procedures / Treatments   Labs (all labs ordered are listed, but only abnormal results are displayed) Labs Reviewed - No data to display  EKG None  Radiology No results found.  Procedures Procedures    Medications Ordered in ED Medications  dexamethasone (DECADRON) injection 10 mg (has no administration in time range)  doxycycline (VIBRA-TABS) tablet 100 mg (has no administration in time range)    ED Course/ Medical Decision Making/ A&P                           Medical Decision Making  Patient with rash that looks suspicious for contact dermatitis but there is some pustular component and therefore must consider  folliculitis.  There is some bilateral pedal edema, likely reactive to the irritation.  Does not require specific treatment.  No concern for DVT.  We will treat with a IM injection of Decadron and then placed on course of doxycycline, follow-up with PCP.  Return if symptoms worsen.        Final Clinical Impression(s) / ED Diagnoses Final diagnoses:  Contact dermatitis, unspecified contact dermatitis type, unspecified trigger  Folliculitis    Rx / DC Orders ED Discharge Orders          Ordered    doxycycline (VIBRAMYCIN) 100 MG capsule  2 times daily        06/28/21 0033              Gilda Crease, MD 06/28/21 [id number]

## 2021-07-29 ENCOUNTER — Other Ambulatory Visit: Payer: Self-pay

## 2021-07-29 ENCOUNTER — Emergency Department (HOSPITAL_COMMUNITY)
Admission: EM | Admit: 2021-07-29 | Discharge: 2021-07-29 | Discharge status: Against Medical Advice | Payer: Medicaid Other | Attending: Emergency Medicine | Admitting: Emergency Medicine

## 2021-07-29 ENCOUNTER — Encounter (HOSPITAL_COMMUNITY): Payer: Self-pay

## 2021-07-29 DIAGNOSIS — R2232 Localized swelling, mass and lump, left upper limb: Secondary | ICD-10-CM | POA: Insufficient documentation

## 2021-07-29 DIAGNOSIS — Z5321 Procedure and treatment not carried out due to patient leaving prior to being seen by health care provider: Secondary | ICD-10-CM | POA: Insufficient documentation

## 2021-07-29 NOTE — ED Triage Notes (Signed)
Pt reports cyst under left arm. Pt reports it being painful and not going away.

## 2021-07-30 ENCOUNTER — Encounter (HOSPITAL_COMMUNITY): Payer: Self-pay | Admitting: Emergency Medicine

## 2021-07-30 ENCOUNTER — Emergency Department (HOSPITAL_COMMUNITY)
Admission: EM | Admit: 2021-07-30 | Discharge: 2021-07-30 | Disposition: A | Discharge status: Home / Self Care | Payer: Self-pay | Attending: Emergency Medicine | Admitting: Emergency Medicine

## 2021-07-30 DIAGNOSIS — L02412 Cutaneous abscess of left axilla: Secondary | ICD-10-CM | POA: Insufficient documentation

## 2021-07-30 MED ORDER — TRAMADOL HCL 50 MG PO TABS: 50.0000 mg | ORAL_TABLET | Freq: Four times a day (QID) | ORAL | 0 refills | Status: DC | PRN

## 2021-07-30 MED ORDER — LIDOCAINE-EPINEPHRINE (PF) 1 %-1:200000 IJ SOLN
10.0000 mL | Freq: Once | INTRAMUSCULAR | Status: AC
  Administered 2021-07-30: 10 mL via INTRADERMAL
  Filled 2021-07-30: qty 30

## 2021-07-30 MED ORDER — POVIDONE-IODINE 10 % EX SOLN
CUTANEOUS | Status: DC | PRN
  Filled 2021-07-30: qty 14.8

## 2021-07-30 MED ORDER — HYDROCODONE-ACETAMINOPHEN 5-325 MG PO TABS
1.0000 | ORAL_TABLET | Freq: Once | ORAL | Status: AC
  Administered 2021-07-30: 1 via ORAL
  Filled 2021-07-30: qty 1

## 2021-07-30 MED ORDER — DOXYCYCLINE HYCLATE 100 MG PO CAPS: 100.0000 mg | ORAL_CAPSULE | Freq: Two times a day (BID) | ORAL | 0 refills | Status: DC

## 2021-07-30 NOTE — ED Triage Notes (Signed)
Abscess under left arm x 6 days. No drainage . Pain 10/10.

## 2021-07-30 NOTE — ED Notes (Signed)
Gauze covering wound secured with trauma tape.

## 2021-07-30 NOTE — Discharge Instructions (Signed)
You had an abscess of your left armpit area.  Recommend that you apply warm wet compresses on and off to this area.  The packing will need to be removed in 2 days.  Take the antibiotic as directed until its finished.  Return here for any signs of worsening infection.  Follow-up with your primary care doctor if needed.

## 2021-08-02 NOTE — ED Provider Notes (Signed)
[facility] EMERGENCY DEPARTMENT Provider Note   CSN: [id number] Arrival date & time: 07/30/21  1006     History  Chief Complaint  Patient presents with   Abscess    [name] is a 21 y.o. female.   Abscess Associated symptoms: no fever, no nausea and no vomiting         [name] is a 21 y.o. female who presents to the Emergency Department complaining of area of redness pain and swelling to the left armpit.  Symptoms present x6 days.  She reports noticing a small "knot" under her arm that has gradually increased in size.  She has applied warm wet compresses without relief.  She denies any pain radiating into her arm, fever, chills, or recent illness or injury.  No history of MRSA.    Home Medications Prior to Admission medications   Medication Sig Start Date End Date Taking? Authorizing Provider  doxycycline (VIBRAMYCIN) 100 MG capsule Take 1 capsule (100 mg total) by mouth 2 (two) times daily. 07/30/21  Yes Baltasar Twilley, PA-C  traMADol (ULTRAM) 50 MG tablet Take 1 tablet (50 mg total) by mouth every 6 (six) hours as needed. 07/30/21  Yes Pio Eatherly, PA-C  norethindrone (MICRONOR) 0.35 MG tablet Take 1 tablet (0.35 mg total) by mouth daily. 11/02/20   Adline Potter, NP      Allergies    Hydrocortisone-emollient    Review of Systems   Review of Systems  Constitutional:  Negative for chills and fever.  Gastrointestinal:  Negative for nausea and vomiting.  Musculoskeletal:  Negative for neck pain and neck stiffness.  Skin:  Positive for color change.       Area of redness and swelling left armpit  Neurological:  Negative for dizziness, weakness and numbness.  Hematological:  Negative for adenopathy.    Physical Exam Updated Vital Signs BP (!) 141/97 (BP Location: Right Arm)   Pulse 70   Temp 98.1 F (36.7 C) (Oral)   Resp 18   Ht 5\' 5"  (1.651 m)   Wt 136 kg   LMP 07/04/2021 (Exact Date)   SpO2 98%   BMI 49.89 kg/m  Physical  Exam Vitals and nursing note reviewed.  Constitutional:      General: She is not in acute distress.    Appearance: Normal appearance. She is well-developed. She is not toxic-appearing.  HENT:     Head: Normocephalic.  Cardiovascular:     Rate and Rhythm: Normal rate and regular rhythm.     Pulses: Normal pulses.  Pulmonary:     Effort: Pulmonary effort is normal. No respiratory distress.     Breath sounds: Normal breath sounds.  Musculoskeletal:     Cervical back: No tenderness.  Lymphadenopathy:     Cervical: No cervical adenopathy.  Skin:    General: Skin is warm.     Capillary Refill: Capillary refill takes less than 2 seconds.     Findings: Erythema present.     Comments: 4 cm area of induration with central fluctuance and mild erythema to left axilla.  No lymphangitis.  No active drainage.  Neurological:     General: No focal deficit present.     Mental Status: She is alert.     Sensory: No sensory deficit.     Motor: No weakness or abnormal muscle tone.     ED Results / Procedures / Treatments   Labs (all labs ordered are listed, but only abnormal results are displayed) Labs Reviewed -  No data to display  EKG None  Radiology No results found.  Procedures Procedures     INCISION AND DRAINAGE Performed by: Taner Rzepka Consent: Verbal consent obtained. Risks and benefits: risks, benefits and alternatives were discussed Type: abscess  Body area: Left axilla  Anesthesia: local infiltration  Incision was made with a scalpel.  Local anesthetic: lidocaine 1% with epinephrine  Anesthetic total: 3 ml  Complexity: complex Blunt dissection to break up loculations  Drainage: purulent  Drainage amount: Large  Packing material: 1/4 in iodoform gauze  Patient tolerance: Patient tolerated the procedure well with no immediate complications.   Medications Ordered in ED Medications  HYDROcodone-acetaminophen (NORCO/VICODIN) 5-325 MG per tablet 1 tablet  (1 tablet Oral Given 07/30/21 1305)  lidocaine-EPINEPHrine (PF) (XYLOCAINE-EPINEPHrine) 1 %-1:200000 (PF) injection 10 mL (10 mLs Intradermal Given 07/30/21 1353)    ED Course/ Medical Decision Making/ A&P                           Medical Decision Making Patient here for evaluation of a focal area of pain and redness with swelling to the left axilla.  Findings consistent with developing abscess.  No history of MRSA.  On exam, patient well-appearing nontoxic.  No focal neurodeficits.  No recent illness or injury to suggest lymphadenopathy.  No concerning symptoms to suggest systemic infection.  Discussed findings with patient and she is agreeable to proceed with I&D  Amount and/or Complexity of Data Reviewed Discussion of management or test interpretation with external provider(s): Successful I&D of abscess left axilla.  Mild surrounding erythema so we will start her on antibiotics.  Area has been packed she is agreeable to treatment plan which includes warm water soaks, packing removal in 2 to 3 days and antibiotics.  She tolerated procedure well and there were no complications.  She will return here for any worsening symptoms.  Appears appropriate for discharge home.  Risk Prescription drug management.           Final Clinical Impression(s) / ED Diagnoses Final diagnoses:  Abscess of axilla, left    Rx / DC Orders ED Discharge Orders          Ordered    doxycycline (VIBRAMYCIN) 100 MG capsule  2 times daily        07/30/21 1437    traMADol (ULTRAM) 50 MG tablet  Every 6 hours PRN        07/30/21 1437              Lukka Black, Babette Relic, PA-C 08/02/21 1522    Franne Forts, DO 08/02/21 2353

## 2022-04-12 DIAGNOSIS — Z23 Encounter for immunization: Secondary | ICD-10-CM | POA: Diagnosis not present

## 2022-04-13 DIAGNOSIS — F418 Other specified anxiety disorders: Secondary | ICD-10-CM | POA: Diagnosis not present

## 2022-04-13 DIAGNOSIS — R5383 Other fatigue: Secondary | ICD-10-CM | POA: Diagnosis not present

## 2022-04-13 DIAGNOSIS — Z13228 Encounter for screening for other metabolic disorders: Secondary | ICD-10-CM | POA: Diagnosis not present

## 2022-04-13 DIAGNOSIS — M2550 Pain in unspecified joint: Secondary | ICD-10-CM | POA: Diagnosis not present

## 2022-04-13 DIAGNOSIS — Z1329 Encounter for screening for other suspected endocrine disorder: Secondary | ICD-10-CM | POA: Diagnosis not present

## 2022-04-13 DIAGNOSIS — Z0001 Encounter for general adult medical examination with abnormal findings: Secondary | ICD-10-CM | POA: Diagnosis not present

## 2022-05-17 DIAGNOSIS — M2550 Pain in unspecified joint: Secondary | ICD-10-CM | POA: Diagnosis not present

## 2022-05-17 DIAGNOSIS — J45909 Unspecified asthma, uncomplicated: Secondary | ICD-10-CM | POA: Diagnosis not present

## 2022-05-17 DIAGNOSIS — E559 Vitamin D deficiency, unspecified: Secondary | ICD-10-CM | POA: Diagnosis not present

## 2022-05-17 DIAGNOSIS — F418 Other specified anxiety disorders: Secondary | ICD-10-CM | POA: Diagnosis not present

## 2022-08-15 ENCOUNTER — Telehealth: Payer: Self-pay

## 2022-08-15 NOTE — Telephone Encounter (Signed)
Attempted call to [facility] client for follow up/wellness, number states not a working number.  PCP [facility] last appointment 05/17/22 listed and no future appointments. No other number listed.    Francee Nodal RN [employer]

## 2022-09-22 ENCOUNTER — Encounter: Payer: Self-pay | Admitting: *Deleted

## 2022-11-22 ENCOUNTER — Ambulatory Visit: Payer: 59 | Admitting: Adult Health

## 2022-12-01 ENCOUNTER — Ambulatory Visit: Payer: 59 | Admitting: Adult Health

## 2023-01-10 DIAGNOSIS — R1013 Epigastric pain: Secondary | ICD-10-CM | POA: Diagnosis not present

## 2023-01-10 DIAGNOSIS — K2900 Acute gastritis without bleeding: Secondary | ICD-10-CM | POA: Diagnosis not present

## 2023-01-10 DIAGNOSIS — R197 Diarrhea, unspecified: Secondary | ICD-10-CM | POA: Diagnosis not present

## 2023-01-10 DIAGNOSIS — N390 Urinary tract infection, site not specified: Secondary | ICD-10-CM | POA: Diagnosis not present

## 2023-01-10 DIAGNOSIS — Z888 Allergy status to other drugs, medicaments and biological substances status: Secondary | ICD-10-CM | POA: Diagnosis not present

## 2023-01-10 DIAGNOSIS — R1011 Right upper quadrant pain: Secondary | ICD-10-CM | POA: Diagnosis not present

## 2023-01-16 ENCOUNTER — Encounter: Payer: Self-pay | Admitting: Adult Health

## 2023-01-16 ENCOUNTER — Ambulatory Visit: Payer: No Typology Code available for payment source | Admitting: Adult Health

## 2023-01-16 VITALS — BP 126/88 | HR 81 | Ht 67.0 in | Wt 323.5 lb

## 2023-01-16 DIAGNOSIS — N926 Irregular menstruation, unspecified: Secondary | ICD-10-CM | POA: Diagnosis not present

## 2023-01-16 DIAGNOSIS — Z319 Encounter for procreative management, unspecified: Secondary | ICD-10-CM | POA: Diagnosis not present

## 2023-01-16 DIAGNOSIS — Z3202 Encounter for pregnancy test, result negative: Secondary | ICD-10-CM

## 2023-01-16 DIAGNOSIS — Z86711 Personal history of pulmonary embolism: Secondary | ICD-10-CM

## 2023-01-16 DIAGNOSIS — E282 Polycystic ovarian syndrome: Secondary | ICD-10-CM

## 2023-01-16 DIAGNOSIS — Z86718 Personal history of other venous thrombosis and embolism: Secondary | ICD-10-CM

## 2023-01-16 LAB — POCT URINE PREGNANCY: Preg Test, Ur: NEGATIVE

## 2023-01-16 NOTE — Progress Notes (Signed)
  Subjective:     Patient ID: [name], female   DOB: September 06, 2000, 23 y.o.   MRN: [id number]  HPI [name] is a 23 year old black female, single, G0P0 in complaining of irregular periods and would like to get pregnant, has female partner.   Last pap was negative 03/12/20  PCP is [facility] Review of Systems Periods irregular  Reviewed past medical,surgical, social and family history. Reviewed medications and allergies.     Objective:   Physical Exam BP 126/88 (BP Location: Left Arm, Patient Position: Sitting, Cuff Size: Large)   Pulse 81   Ht 5' 7 (1.702 m)   Wt (!) 323 lb 8 oz (146.7 kg)   LMP 01/07/2023   BMI 50.67 kg/m   UPT is negative Skin warm and dry.Lungs: clear to ausculation bilaterally. Cardiovascular: regular rate and rhythm.  Fall risk is low  Upstream - 01/16/23 1229       Pregnancy Intention Screening   Does the patient want to become pregnant in the next year? Yes    Does the patient's partner want to become pregnant in the next year? Yes    Would the patient like to discuss contraceptive options today? Yes      Contraception Wrap Up   Current Method No Method - Other Reason    End Method No Method - Other Reason    Contraception Counseling Provided No                Assessment:     1. Pregnancy examination or test, negative result - POCT urine pregnancy  2. Irregular periods (Primary) Periods not regular will skip 3-4 months Did have one 01/07/23 Do not want to skip period for over 3 months, can cycle with provera or use POP, will discuss more after progesterone level back  - Progesterone  3. PCO (polycystic ovaries)  4. Patient desires pregnancy Will check progesterone level 01/27/23 to see if ovulated this cycle - Progesterone She has female, partner, can refer to fertility specialist when ready Aware will need lovenox  if gets pregnant  5. History of pulmonary embolus (PE) Had PE after COVID in 2023  6. History of DVT  (deep vein thrombosis) Had clots in legs after COVID in 2023  7. Morbid obesity (HCC) Weight loss encouraged  Talk with PCP about GLP1 as option for weight loss    Plan:     Return in 5 weeks for pap and physical

## 2023-02-20 ENCOUNTER — Ambulatory Visit: Payer: No Typology Code available for payment source | Admitting: Adult Health

## 2023-02-20 ENCOUNTER — Other Ambulatory Visit (HOSPITAL_COMMUNITY)
Admission: RE | Admit: 2023-02-20 | Discharge: 2023-02-20 | Disposition: A | Discharge status: Home / Self Care | Payer: No Typology Code available for payment source | Source: Ambulatory Visit | Attending: Adult Health | Admitting: Adult Health

## 2023-02-20 ENCOUNTER — Encounter: Payer: Self-pay | Admitting: Adult Health

## 2023-02-20 VITALS — BP 142/94 | HR 81 | Ht 66.0 in | Wt 328.5 lb

## 2023-02-20 DIAGNOSIS — Z1151 Encounter for screening for human papillomavirus (HPV): Secondary | ICD-10-CM

## 2023-02-20 DIAGNOSIS — F32A Depression, unspecified: Secondary | ICD-10-CM

## 2023-02-20 DIAGNOSIS — E282 Polycystic ovarian syndrome: Secondary | ICD-10-CM

## 2023-02-20 DIAGNOSIS — Z0000 Encounter for general adult medical examination without abnormal findings: Secondary | ICD-10-CM | POA: Diagnosis present

## 2023-02-20 DIAGNOSIS — Z1331 Encounter for screening for depression: Secondary | ICD-10-CM

## 2023-02-20 DIAGNOSIS — Z01419 Encounter for gynecological examination (general) (routine) without abnormal findings: Secondary | ICD-10-CM

## 2023-02-20 DIAGNOSIS — N926 Irregular menstruation, unspecified: Secondary | ICD-10-CM

## 2023-02-20 DIAGNOSIS — Z86711 Personal history of pulmonary embolism: Secondary | ICD-10-CM

## 2023-02-20 DIAGNOSIS — F419 Anxiety disorder, unspecified: Secondary | ICD-10-CM

## 2023-02-20 DIAGNOSIS — I10 Essential (primary) hypertension: Secondary | ICD-10-CM

## 2023-02-20 DIAGNOSIS — Z86718 Personal history of other venous thrombosis and embolism: Secondary | ICD-10-CM

## 2023-02-20 MED ORDER — HYDROCHLOROTHIAZIDE 12.5 MG PO CAPS: 12.5000 mg | ORAL_CAPSULE | Freq: Every day | ORAL | 6 refills | Status: DC

## 2023-02-20 NOTE — Progress Notes (Signed)
 Patient ID: [name], female   DOB: 04-13-2000, 23 y.o.   MRN: [id number] History of Present Illness: [name] is a 23 year old black female,single, G0P0, in for a well woman gyn exam and pap.  PCP is [facility]   Current Medications, Allergies, Past Medical History, Past Surgical History, Family History and Social History were reviewed in [employer] record.     Review of Systems: Patient denies any headaches, hearing loss, fatigue, blurred vision, shortness of breath, chest pain, abdominal pain, problems with bowel movements, urination, or intercourse. No joint pain or mood swings.  +stress Periods irregular, had 01/07/23 to 02/12/23 spotting yesterday   Physical Exam:BP (!) 142/94 (BP Location: Right Arm, Patient Position: Sitting, Cuff Size: Large)   Pulse 81   Ht 5\' 6"  (1.676 m)   Wt (!) 328 lb 8 oz (149 kg)   LMP 02/19/2023   BMI 53.02 kg/m   General:  Well developed, well nourished, no acute distress Skin:  Warm and dry Neck:  Midline trachea, normal thyroid , good ROM, no lymphadenopathy Lungs; Clear to auscultation bilaterally Breast:  No dominant palpable mass, retraction, or nipple discharge Cardiovascular: Regular rate and rhythm Abdomen:  Soft, non tender, no hepatosplenomegaly Pelvic:  External genitalia is normal in appearance, no lesions.  The vagina is normal in appearance. Urethra has no lesions or masses. The cervix is smooth, pap with GC/CHL,and HPV reflex performed.  Uterus is felt to be normal size, shape, and contour.  No adnexal masses or tenderness noted.Bladder is non tender, no masses felt Extremities/musculoskeletal:  No swelling or varicosities noted, no clubbing or cyanosis Psych:  No mood changes, alert and cooperative,seems happy AA is 4 Fall risk is low    02/20/2023   11:49 AM 03/12/2020    8:50 AM  Depression screen PHQ 2/9  Decreased Interest 3 0  Down, Depressed, Hopeless 2 0  PHQ - 2 Score 5 0  Altered  sleeping 3 0  Tired, decreased energy 3 0  Change in appetite 2 0  Feeling bad or failure about yourself  0 0  Trouble concentrating 0 0  Moving slowly or fidgety/restless 1 0  Suicidal thoughts 1 0  PHQ-9 Score 15 0       02/20/2023   11:49 AM 03/12/2020    8:50 AM  GAD 7 : Generalized Anxiety Score  Nervous, Anxious, on Edge 3 1  Control/stop worrying 2 1  Worry too much - different things 2 1  Trouble relaxing 2 1  Restless 1 0  Easily annoyed or irritable 3 2  Afraid - awful might happen 3 3  Total GAD 7 Score 16 9    Upstream - 02/20/23 1143       Pregnancy Intention Screening   Does the patient want to become pregnant in the next year? Unsure    Does the patient's partner want to become pregnant in the next year? Unsure      Contraception Wrap Up   Current Method Withdrawal or Other Method   female partner   End Method Withdrawal or Other Method   female partner             Examination chaperoned by Alphonso Aschoff LPN   Impression and plan: 1. Routine general medical examination at a health care facility (Primary) Pap sent Pap in 3 years if normal Physical in 1 year Labs with PCP - Cytology - PAP( [facility])  2. Irregular periods Had period 12/28  to 02/12/23 But will skip for months Will cycle with provera every 3 months if no period  3. PCO (polycystic ovaries)   4. History of pulmonary embolus (PE) Had PE after COVID 3 years ago  5. History of DVT (deep vein thrombosis) Had DVT after COVID 3 years ago   6. Morbid obesity (HCC) Trying to lose weight, PCP was trying to get [facility] approved  7. Anxiety and depression Declines meds, at this time Denies any SI, or HI  8. Hypertension, unspecified type Will rx Microzide  12.5 mg 1 daily for BP, follow up with PCP(has appt 03/14/23)   Follow up with me in 3 months for BP check and to see if has had period

## 2023-02-22 ENCOUNTER — Telehealth: Payer: Self-pay

## 2023-02-22 NOTE — Telephone Encounter (Signed)
Attempted call for follow up and wellness. No answer, left message. Noted that client now is active with [facility] and is now going to [facility] and her next appt is 03/14/23.  Will change [facility] status to discontinued due to insurance. Documents confirmed with [employer] and scanned into [facility] documents and enrollment changed.   Francee Nodal RN [employer]

## 2023-02-27 LAB — CYTOLOGY - PAP
Chlamydia: NEGATIVE
Comment: NEGATIVE
Comment: NEGATIVE
Comment: NORMAL
Diagnosis: NEGATIVE
High risk HPV: NEGATIVE
Neisseria Gonorrhea: NEGATIVE

## 2023-02-28 ENCOUNTER — Encounter (HOSPITAL_COMMUNITY): Payer: Self-pay

## 2023-02-28 ENCOUNTER — Emergency Department (HOSPITAL_COMMUNITY)
Admission: EM | Admit: 2023-02-28 | Discharge: 2023-03-01 | Disposition: A | Discharge status: Home / Self Care | Payer: No Typology Code available for payment source | Attending: Emergency Medicine | Admitting: Emergency Medicine

## 2023-02-28 ENCOUNTER — Other Ambulatory Visit: Payer: Self-pay

## 2023-02-28 DIAGNOSIS — R112 Nausea with vomiting, unspecified: Secondary | ICD-10-CM | POA: Insufficient documentation

## 2023-02-28 DIAGNOSIS — R519 Headache, unspecified: Secondary | ICD-10-CM | POA: Insufficient documentation

## 2023-02-28 DIAGNOSIS — R509 Fever, unspecified: Secondary | ICD-10-CM | POA: Diagnosis not present

## 2023-02-28 DIAGNOSIS — Z20822 Contact with and (suspected) exposure to covid-19: Secondary | ICD-10-CM | POA: Insufficient documentation

## 2023-02-28 DIAGNOSIS — R197 Diarrhea, unspecified: Secondary | ICD-10-CM | POA: Diagnosis not present

## 2023-02-28 DIAGNOSIS — R059 Cough, unspecified: Secondary | ICD-10-CM | POA: Diagnosis not present

## 2023-02-28 DIAGNOSIS — R109 Unspecified abdominal pain: Secondary | ICD-10-CM | POA: Insufficient documentation

## 2023-02-28 NOTE — ED Triage Notes (Signed)
Pt states she started feeling sick today with emesis, nausea, diarrhea, fever.

## 2023-03-01 LAB — COMPREHENSIVE METABOLIC PANEL
ALT: 19 U/L (ref 0–44)
AST: 17 U/L (ref 15–41)
Albumin: 3.8 g/dL (ref 3.5–5.0)
Alkaline Phosphatase: 63 U/L (ref 38–126)
Anion gap: 9 (ref 5–15)
BUN: 12 mg/dL (ref 6–20)
CO2: 20 mmol/L — ABNORMAL LOW (ref 22–32)
Calcium: 8.7 mg/dL — ABNORMAL LOW (ref 8.9–10.3)
Chloride: 107 mmol/L (ref 98–111)
Creatinine, Ser: 0.97 mg/dL (ref 0.44–1.00)
GFR, Estimated: >60 mL/min (ref >60)
Glucose, Bld: 110 mg/dL — ABNORMAL HIGH (ref 70–99)
Potassium: 3.7 mmol/L (ref 3.5–5.1)
Sodium: 136 mmol/L (ref 135–145)
Total Bilirubin: 1.4 mg/dL — ABNORMAL HIGH (ref 0.0–1.2)
Total Protein: 7.4 g/dL (ref 6.5–8.1)

## 2023-03-01 LAB — CBC
HCT: 36.8 % (ref 36.0–46.0)
Hemoglobin: 12.4 g/dL (ref 12.0–15.0)
MCH: 28.5 pg (ref 26.0–34.0)
MCHC: 33.7 g/dL (ref 30.0–36.0)
MCV: 84.6 fL (ref 80.0–100.0)
Platelets: 277 10*3/uL (ref 150–400)
RBC: 4.35 MIL/uL (ref 3.87–5.11)
RDW: 12.8 % (ref 11.5–15.5)
WBC: 7.3 10*3/uL (ref 4.0–10.5)
nRBC: 0 % (ref 0.0–0.2)

## 2023-03-01 LAB — POC URINE PREG, ED: Preg Test, Ur: NEGATIVE

## 2023-03-01 LAB — RESP PANEL BY RT-PCR (RSV, FLU A&B, COVID)  RVPGX2
Influenza A by PCR: NEGATIVE
Influenza B by PCR: NEGATIVE
Resp Syncytial Virus by PCR: NEGATIVE
SARS Coronavirus 2 by RT PCR: NEGATIVE

## 2023-03-01 LAB — LIPASE, BLOOD: Lipase: 22 U/L (ref 11–51)

## 2023-03-01 MED ORDER — ONDANSETRON 8 MG PO TBDP
8.0000 mg | ORAL_TABLET | Freq: Once | ORAL | Status: AC
  Administered 2023-03-01: 8 mg via ORAL
  Filled 2023-03-01: qty 1

## 2023-03-01 NOTE — ED Provider Notes (Signed)
[place] EMERGENCY DEPARTMENT AT [facility] Provider Note   CSN: [id number] Arrival date & time: 02/28/23  2340     History  Chief Complaint  Patient presents with   Fever   Emesis    [name] is a 23 y.o. female.  The history is provided by the patient.  Patient with history of obesity presents with nausea vomiting abdominal pain and diarrhea.  She also reports fevers.  She reports yesterday she started having abdominal discomfort.  Later in the day she woke up and had multiple episodes of emesis as well as nonbloody diarrhea.  She reports continued abdominal discomfort and headache.  She also reports fevers and mild cough.  No recent travel.  No known sick contacts.  No previous abdominal surgeries.     Home Medications Prior to Admission medications   Medication Sig Start Date End Date Taking? Authorizing Provider  Albuterol Sulfate (PROAIR RESPICLICK) 108 (90 Base) MCG/ACT AEPB 1 puff Inhalation every 4 hours for 30 days As needed    [provider]  cetirizine (ZYRTEC) 10 MG tablet 1 tablet Oral Once a day for 30 days    [provider]  hydrochlorothiazide (MICROZIDE) 12.5 MG capsule Take 1 capsule (12.5 mg total) by mouth daily. 02/20/23   Adline Potter, NP  ondansetron (ZOFRAN-ODT) 4 MG disintegrating tablet DISSOLVE ONE TABLET UNDER THE TONGUE EVERY 8 HOURS AS NEEDED Oral for 5 Days    [provider]  rizatriptan (MAXALT-MLT) 10 MG disintegrating tablet 1 tablet: as a single dose; if symptoms persist or return, may repeat dose after 2 hours Orally Once a day for 10 days 02/15/23   [provider]  topiramate (TOPAMAX) 25 MG tablet 1 tablet Orally Once a day for 30 days 02/15/23   [provider]      Allergies    Hydrocortisone-emollient    Review of Systems   Review of Systems  Constitutional:  Positive for fatigue and fever.  Gastrointestinal:  Positive for abdominal pain, diarrhea, nausea and  vomiting. Negative for blood in stool.  Genitourinary:  Negative for dysuria.    Physical Exam Updated Vital Signs BP (!) 141/89   Pulse 80   Temp 99.5 F (37.5 C) (Oral)   Resp 18   LMP 02/19/2023   SpO2 97%  Physical Exam CONSTITUTIONAL: Well developed/well nourished HEAD: Normocephalic/atraumatic EYES: EOMI/PERRL, no icterus ENMT: Mucous membranes moist NECK: supple no meningeal signs CV: S1/S2 noted, no murmurs/rubs/gallops noted LUNGS: Lungs are clear to auscultation bilaterally, no apparent distress ABDOMEN: soft, nontender, no rebound or guarding, bowel sounds noted throughout abdomen NEURO: Pt is awake/alert/appropriate, moves all extremitiesx4.  No facial droop.   EXTREMITIES: pulses normal/equal, full ROM SKIN: warm, color normal PSYCH: no abnormalities of mood noted, alert and oriented to situation  ED Results / Procedures / Treatments   Labs (all labs ordered are listed, but only abnormal results are displayed) Labs Reviewed  COMPREHENSIVE METABOLIC PANEL - Abnormal; Notable for the following components:      Result Value   CO2 20 (*)    Glucose, Bld 110 (*)    Calcium 8.7 (*)    Total Bilirubin 1.4 (*)    All other components within normal limits  RESP PANEL BY RT-PCR (RSV, FLU A&B, COVID)  RVPGX2  LIPASE, BLOOD  CBC  POC URINE PREG, ED    EKG None  Radiology No results found.  Procedures Procedures    Medications Ordered in ED Medications  ondansetron (ZOFRAN-ODT) disintegrating tablet 8 mg (8 mg Oral Given 03/01/23 0325)    ED Course/ Medical Decision Making/ A&P                                 Medical Decision Making Amount and/or Complexity of Data Reviewed Labs: ordered.  Risk Prescription drug management.   This patient presents to the ED for concern of abdominal pain, this involves an extensive number of treatment options, and is a complaint that carries with it a high risk of complications and morbidity.  The differential  diagnosis includes but is not limited to cholecystitis, cholelithiasis, pancreatitis, gastritis, peptic ulcer disease, appendicitis, bowel obstruction, bowel perforation, diverticulitis, ectopic pregnancy, TOA, pelvic inflammatory disease, gastroenteritis   Comorbidities that complicate the patient evaluation: Patient's presentation is complicated by their history of obesity  Lab Tests: I Ordered, and personally interpreted labs.  The pertinent results include: Labs overall unremarkable   Medicines ordered and prescription drug management: I ordered medication including Zofran for nausea Reevaluation of the patient after these medicines showed that the patient    improved  Test Considered: I considered CT abdomen/pelvis, patient is improving without focal abdominal tenderness, will defer for now   Reevaluation: After the interventions noted above, I reevaluated the patient and found that they have :improved  Complexity of problems addressed: Patient's presentation is most consistent with  acute presentation with potential threat to life or bodily function  Disposition: After consideration of the diagnostic results and the patient's response to treatment,  I feel that the patent would benefit from discharge   .    Strong suspicion this represents a viral gastroenteritis that is self-limited and will resolve, patient is already feeling improved after receiving Zofran, no focal abdominal tenderness We discussed return precautions  Patient is appropriate for d/c home.  I doubt acute abdominal emergency at this time.  We discussed strict ER return precautions including abdominal pain that migrates to RLQ, fever >100.21F with repetitive vomiting over next 8-12 hours       Final Clinical Impression(s) / ED Diagnoses Final diagnoses:  Nausea vomiting and diarrhea    Rx / DC Orders ED Discharge Orders     None         Mariah Rhine, MD 03/01/23 [phone]

## 2023-03-01 NOTE — Discharge Instructions (Signed)

## 2023-03-20 ENCOUNTER — Other Ambulatory Visit: Payer: Self-pay

## 2023-03-20 ENCOUNTER — Emergency Department (HOSPITAL_COMMUNITY)
Admission: EM | Admit: 2023-03-20 | Discharge: 2023-03-21 | Disposition: A | Discharge status: Home / Self Care | Attending: Emergency Medicine | Admitting: Emergency Medicine

## 2023-03-20 ENCOUNTER — Encounter (HOSPITAL_COMMUNITY): Payer: Self-pay

## 2023-03-20 DIAGNOSIS — Z79899 Other long term (current) drug therapy: Secondary | ICD-10-CM | POA: Diagnosis not present

## 2023-03-20 DIAGNOSIS — J101 Influenza due to other identified influenza virus with other respiratory manifestations: Secondary | ICD-10-CM | POA: Insufficient documentation

## 2023-03-20 DIAGNOSIS — Z20822 Contact with and (suspected) exposure to covid-19: Secondary | ICD-10-CM | POA: Insufficient documentation

## 2023-03-20 DIAGNOSIS — R519 Headache, unspecified: Secondary | ICD-10-CM | POA: Diagnosis present

## 2023-03-20 LAB — RESP PANEL BY RT-PCR (RSV, FLU A&B, COVID)  RVPGX2
Influenza A by PCR: POSITIVE — AB
Influenza B by PCR: NEGATIVE
Resp Syncytial Virus by PCR: NEGATIVE
SARS Coronavirus 2 by RT PCR: NEGATIVE

## 2023-03-20 MED ORDER — ACETAMINOPHEN 500 MG PO TABS
1000.0000 mg | ORAL_TABLET | Freq: Once | ORAL | Status: AC
  Administered 2023-03-20: 1000 mg via ORAL
  Filled 2023-03-20: qty 2

## 2023-03-20 NOTE — ED Triage Notes (Signed)
 Pt to ED with c/o flu like sx that started yesterday.

## 2023-03-21 NOTE — ED Provider Notes (Signed)
 [place] EMERGENCY DEPARTMENT AT [facility] Provider Note   CSN: [id number] Arrival date & time: 03/20/23  6387     History  Chief Complaint  Patient presents with   Influenza    Flu like sx     [name] is a 23 y.o. female.  Patient is a 23 year old female presenting with a 24-hour history of bodyaches, fever, headache, cough, nausea, and feeling generally unwell.  She denies any ill contacts.  No aggravating or alleviating factors.  The history is provided by the patient.       Home Medications Prior to Admission medications   Medication Sig Start Date End Date Taking? Authorizing Provider  Albuterol Sulfate (PROAIR RESPICLICK) 108 (90 Base) MCG/ACT AEPB 1 puff Inhalation every 4 hours for 30 days As needed    [provider]  cetirizine (ZYRTEC) 10 MG tablet 1 tablet Oral Once a day for 30 days    [provider]  hydrochlorothiazide (MICROZIDE) 12.5 MG capsule Take 1 capsule (12.5 mg total) by mouth daily. 02/20/23   Adline Potter, NP  ondansetron (ZOFRAN-ODT) 4 MG disintegrating tablet DISSOLVE ONE TABLET UNDER THE TONGUE EVERY 8 HOURS AS NEEDED Oral for 5 Days    [provider]  rizatriptan (MAXALT-MLT) 10 MG disintegrating tablet 1 tablet: as a single dose; if symptoms persist or return, may repeat dose after 2 hours Orally Once a day for 10 days 02/15/23   [provider]  topiramate (TOPAMAX) 25 MG tablet 1 tablet Orally Once a day for 30 days 02/15/23   [provider]      Allergies    Hydrocortisone-emollient    Review of Systems   Review of Systems  All other systems reviewed and are negative.   Physical Exam Updated Vital Signs BP (!) 157/124 (BP Location: Right Arm)   Pulse (!) 125   Temp (!) 102.7 F (39.3 C) (Oral)   Resp (!) 24   Ht 5\' 6"  (1.676 m)   Wt (!) 144.2 kg   LMP 02/19/2023 (Approximate)   SpO2 99%   BMI 51.33 kg/m  Physical Exam Vitals and nursing note reviewed.   Constitutional:      General: She is not in acute distress.    Appearance: She is well-developed. She is not diaphoretic.  HENT:     Head: Normocephalic and atraumatic.  Cardiovascular:     Rate and Rhythm: Normal rate and regular rhythm.     Heart sounds: No murmur heard.    No friction rub. No gallop.  Pulmonary:     Effort: Pulmonary effort is normal. No respiratory distress.     Breath sounds: Normal breath sounds. No wheezing.  Abdominal:     General: Bowel sounds are normal. There is no distension.     Palpations: Abdomen is soft.     Tenderness: There is no abdominal tenderness.  Musculoskeletal:        General: Normal range of motion.     Cervical back: Normal range of motion and neck supple.  Skin:    General: Skin is warm and dry.  Neurological:     General: No focal deficit present.     Mental Status: She is alert and oriented to person, place, and time.     ED Results / Procedures / Treatments   Labs (all labs ordered are listed, but only abnormal results are displayed) Labs Reviewed  RESP PANEL BY RT-PCR (RSV, FLU A&B, COVID)  RVPGX2 - Abnormal;  Notable for the following components:      Result Value   Influenza A by PCR POSITIVE (*)    All other components within normal limits    EKG None  Radiology No results found.  Procedures Procedures    Medications Ordered in ED Medications  acetaminophen (TYLENOL) tablet 1,000 mg (1,000 mg Oral Given 03/20/23 2009)    ED Course/ Medical Decision Making/ A&P  Patient is a 23 year old female presenting with flulike symptoms as described in the HPI.  Patient does arrive here with a temp of 102.7, but otherwise stable vital signs.  Influenza A test is positive.  Patient offered Tamiflu, but declines.  Patient will be recommended to take Tylenol/Motrin, drink plenty of fluids, take over-the-counter medications, and follow-up as needed.  Work excuse given for the next 3 days.  Final Clinical Impression(s) / ED  Diagnoses Final diagnoses:  None    Rx / DC Orders ED Discharge Orders     None         Geoffery Lyons, MD 03/21/23 0008

## 2023-03-21 NOTE — Discharge Instructions (Signed)
 Drink plenty of fluids and get plenty of rest.  Take Tylenol 1000 mg rotated with ibuprofen 600 mg every 4 hours as needed for pain or fever.  Begin taking over-the-counter medications as needed for relief of symptoms.  Follow-up with primary doctor if not improving in the next few days.

## 2023-05-19 ENCOUNTER — Ambulatory Visit: Payer: No Typology Code available for payment source | Admitting: Adult Health

## 2023-05-19 ENCOUNTER — Encounter: Payer: Self-pay | Admitting: Adult Health

## 2023-05-19 VITALS — BP 136/90 | HR 112 | Ht 66.0 in | Wt 333.0 lb

## 2023-05-19 DIAGNOSIS — N926 Irregular menstruation, unspecified: Secondary | ICD-10-CM

## 2023-05-19 DIAGNOSIS — I10 Essential (primary) hypertension: Secondary | ICD-10-CM | POA: Diagnosis not present

## 2023-05-19 MED ORDER — HYDROCHLOROTHIAZIDE 12.5 MG PO CAPS: 12.5000 mg | ORAL_CAPSULE | Freq: Every day | ORAL | 6 refills | Status: AC

## 2023-05-19 NOTE — Progress Notes (Signed)
  Subjective:     Patient ID: Clementine L Buss, female   DOB: September 12, 2000, 23 y.o.   MRN: [id number]  HPI Clairissa is a 23 year old black female,single, G0P0, back in follow up on BP and period. She has not had to take provera, had period in April and May. She is not taking BP meds yet, insurance not covering and she was not offered cash price.    Component Value Date/Time   DIAGPAP  02/20/2023 1146    - Negative for intraepithelial lesion or malignancy (NILM)   DIAGPAP  03/12/2020 0859    - Negative for intraepithelial lesion or malignancy (NILM)   HPVHIGH Negative 02/20/2023 1146   ADEQPAP  02/20/2023 1146    Satisfactory for evaluation; transformation zone component PRESENT.   ADEQPAP  03/12/2020 0859    Satisfactory for evaluation; transformation zone component PRESENT.   PCP Is at [facility]  Review of Systems Has had period in April  and May on her own Reviewed past medical,surgical, social and family history. Reviewed medications and allergies.     Objective:   Physical Exam BP (!) 136/90 (BP Location: Left Arm, Patient Position: Sitting, Cuff Size: Large)   Pulse (!) 112   Ht 5\' 6"  (1.676 m)   Wt (!) 333 lb (151 kg)   LMP 05/17/2023 (Exact Date)   BMI 53.75 kg/m     Skin warm and dry.  Lungs: clear to ausculation bilaterally. Cardiovascular: regular rate and rhythm.  Upstream - 05/19/23 0913       Pregnancy Intention Screening   Does the patient want to become pregnant in the next year? Yes    Does the patient's partner want to become pregnant in the next year? Yes    Would the patient like to discuss contraceptive options today? No      Contraception Wrap Up   Current Method Pregnant/Seeking Pregnancy    End Method Pregnant/Seeking Pregnancy    Contraception Counseling Provided No              Assessment:     1. Hypertension, unspecified type (Primary) Has not taken BP meds yet Will resend microzide  to pharmacy, ask cash price Meds ordered  this encounter  Medications   hydrochlorothiazide  (MICROZIDE ) 12.5 MG capsule    Sig: Take 1 capsule (12.5 mg total) by mouth daily.    Dispense:  30 capsule    Refill:  6    Supervising Provider:   Evalyn Hillier H [[id number]]     2. Irregular periods Periods have been more regular, had April and May    Plan:     Follow  up in 3 months for BP check and see if continues to have regular periods

## 2023-08-18 ENCOUNTER — Ambulatory Visit: Admitting: Adult Health

## 2023-09-29 ENCOUNTER — Encounter: Payer: Self-pay | Admitting: *Deleted

## 2024-02-15 ENCOUNTER — Encounter: Payer: Self-pay | Admitting: Adult Health
